# Patient Record
Sex: Female | Born: 1935 | Race: White | Hispanic: No | Smoking: Never smoker
Health system: Southern US, Community
[De-identification: ages and names within clinical notes are randomized; demographics above are authoritative.]

## PROBLEM LIST (undated history)

## (undated) DIAGNOSIS — F419 Anxiety disorder, unspecified: Secondary | ICD-10-CM

## (undated) DIAGNOSIS — M810 Age-related osteoporosis without current pathological fracture: Secondary | ICD-10-CM

## (undated) DIAGNOSIS — C801 Malignant (primary) neoplasm, unspecified: Secondary | ICD-10-CM

## (undated) DIAGNOSIS — I10 Essential (primary) hypertension: Secondary | ICD-10-CM

## (undated) DIAGNOSIS — K219 Gastro-esophageal reflux disease without esophagitis: Secondary | ICD-10-CM

## (undated) DIAGNOSIS — E119 Type 2 diabetes mellitus without complications: Secondary | ICD-10-CM

## (undated) DIAGNOSIS — M1990 Unspecified osteoarthritis, unspecified site: Secondary | ICD-10-CM

## (undated) HISTORY — PX: CHOLECYSTECTOMY: SHX55

## (undated) HISTORY — DX: Essential (primary) hypertension: I10

## (undated) HISTORY — PX: BREAST SURGERY: SHX581

## (undated) HISTORY — DX: Age-related osteoporosis without current pathological fracture: M81.0

## (undated) HISTORY — DX: Gastro-esophageal reflux disease without esophagitis: K21.9

## (undated) HISTORY — PX: APPENDECTOMY: SHX54

## (undated) HISTORY — DX: Unspecified osteoarthritis, unspecified site: M19.90

## (undated) HISTORY — DX: Malignant (primary) neoplasm, unspecified: C80.1

## (undated) HISTORY — PX: TONSILLECTOMY: SUR1361

## (undated) HISTORY — DX: Anxiety disorder, unspecified: F41.9

## (undated) HISTORY — PX: EYE SURGERY: SHX253

## (undated) HISTORY — DX: Type 2 diabetes mellitus without complications: E11.9

## (undated) HISTORY — PX: THUMB ARTHROSCOPY: SHX2509

---
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
## 1998-05-15 ENCOUNTER — Encounter: Payer: Self-pay | Admitting: Family Medicine

## 1998-05-15 ENCOUNTER — Ambulatory Visit (HOSPITAL_COMMUNITY): Admission: RE | Admit: 1998-05-15 | Discharge: 1998-05-15 | Payer: Self-pay | Admitting: Family Medicine

## 1999-02-16 ENCOUNTER — Other Ambulatory Visit: Admission: RE | Admit: 1999-02-16 | Discharge: 1999-02-16 | Payer: Self-pay | Admitting: Family Medicine

## 1999-11-17 ENCOUNTER — Ambulatory Visit (HOSPITAL_BASED_OUTPATIENT_CLINIC_OR_DEPARTMENT_OTHER): Admission: RE | Admit: 1999-11-17 | Discharge: 1999-11-17 | Payer: Self-pay | Admitting: Orthopedic Surgery

## 2000-05-26 ENCOUNTER — Other Ambulatory Visit: Admission: RE | Admit: 2000-05-26 | Discharge: 2000-05-26 | Payer: Self-pay | Admitting: Family Medicine

## 2000-06-07 ENCOUNTER — Encounter (HOSPITAL_BASED_OUTPATIENT_CLINIC_OR_DEPARTMENT_OTHER): Payer: Self-pay | Admitting: General Surgery

## 2000-06-07 ENCOUNTER — Encounter: Admission: RE | Admit: 2000-06-07 | Discharge: 2000-06-07 | Payer: Self-pay | Admitting: General Surgery

## 2000-12-06 ENCOUNTER — Encounter: Payer: Self-pay | Admitting: Family Medicine

## 2000-12-06 ENCOUNTER — Encounter: Admission: RE | Admit: 2000-12-06 | Discharge: 2000-12-06 | Payer: Self-pay | Admitting: Family Medicine

## 2000-12-21 ENCOUNTER — Encounter (INDEPENDENT_AMBULATORY_CARE_PROVIDER_SITE_OTHER): Payer: Self-pay | Admitting: Specialist

## 2000-12-21 ENCOUNTER — Ambulatory Visit (HOSPITAL_COMMUNITY): Admission: RE | Admit: 2000-12-21 | Discharge: 2000-12-21 | Payer: Self-pay | Admitting: Gastroenterology

## 2001-07-05 ENCOUNTER — Encounter: Admission: RE | Admit: 2001-07-05 | Discharge: 2001-07-05 | Payer: Self-pay | Admitting: Family Medicine

## 2001-07-05 ENCOUNTER — Encounter: Payer: Self-pay | Admitting: Family Medicine

## 2002-05-02 ENCOUNTER — Encounter: Payer: Self-pay | Admitting: Otolaryngology

## 2002-05-02 ENCOUNTER — Encounter: Admission: RE | Admit: 2002-05-02 | Discharge: 2002-05-02 | Payer: Self-pay | Admitting: Otolaryngology

## 2002-07-16 ENCOUNTER — Other Ambulatory Visit: Admission: RE | Admit: 2002-07-16 | Discharge: 2002-07-16 | Payer: Self-pay | Admitting: Family Medicine

## 2002-07-31 ENCOUNTER — Encounter: Admission: RE | Admit: 2002-07-31 | Discharge: 2002-07-31 | Payer: Self-pay | Admitting: Family Medicine

## 2002-07-31 ENCOUNTER — Encounter: Payer: Self-pay | Admitting: Family Medicine

## 2003-02-08 ENCOUNTER — Ambulatory Visit (HOSPITAL_COMMUNITY): Admission: RE | Admit: 2003-02-08 | Discharge: 2003-02-08 | Payer: Self-pay | Admitting: Gastroenterology

## 2003-02-08 ENCOUNTER — Encounter (INDEPENDENT_AMBULATORY_CARE_PROVIDER_SITE_OTHER): Payer: Self-pay | Admitting: Specialist

## 2003-03-09 HISTORY — PX: KNEE SURGERY: SHX244

## 2003-06-20 ENCOUNTER — Encounter: Admission: RE | Admit: 2003-06-20 | Discharge: 2003-06-20 | Payer: Self-pay | Admitting: Orthopaedic Surgery

## 2003-06-25 ENCOUNTER — Ambulatory Visit (HOSPITAL_BASED_OUTPATIENT_CLINIC_OR_DEPARTMENT_OTHER): Admission: RE | Admit: 2003-06-25 | Discharge: 2003-06-25 | Payer: Self-pay | Admitting: Orthopaedic Surgery

## 2003-06-25 ENCOUNTER — Ambulatory Visit: Admission: RE | Admit: 2003-06-25 | Discharge: 2003-06-25 | Payer: Self-pay | Admitting: Orthopaedic Surgery

## 2003-08-22 ENCOUNTER — Encounter: Admission: RE | Admit: 2003-08-22 | Discharge: 2003-08-22 | Payer: Self-pay | Admitting: Family Medicine

## 2003-09-12 ENCOUNTER — Other Ambulatory Visit: Admission: RE | Admit: 2003-09-12 | Discharge: 2003-09-12 | Payer: Self-pay | Admitting: Family Medicine

## 2004-02-17 ENCOUNTER — Ambulatory Visit (HOSPITAL_COMMUNITY): Admission: RE | Admit: 2004-02-17 | Discharge: 2004-02-17 | Payer: Self-pay | Admitting: Specialist

## 2004-09-11 ENCOUNTER — Encounter: Admission: RE | Admit: 2004-09-11 | Discharge: 2004-09-11 | Payer: Self-pay | Admitting: Family Medicine

## 2005-03-30 ENCOUNTER — Encounter: Admission: RE | Admit: 2005-03-30 | Discharge: 2005-06-28 | Payer: Self-pay | Admitting: Family Medicine

## 2005-09-16 ENCOUNTER — Encounter: Admission: RE | Admit: 2005-09-16 | Discharge: 2005-09-16 | Payer: Self-pay | Admitting: Family Medicine

## 2005-09-24 ENCOUNTER — Encounter: Admission: RE | Admit: 2005-09-24 | Discharge: 2005-09-24 | Payer: Self-pay | Admitting: Family Medicine

## 2006-04-11 ENCOUNTER — Encounter: Admission: RE | Admit: 2006-04-11 | Discharge: 2006-04-11 | Payer: Self-pay | Admitting: Family Medicine

## 2006-09-27 ENCOUNTER — Encounter: Admission: RE | Admit: 2006-09-27 | Discharge: 2006-09-27 | Payer: Self-pay | Admitting: Family Medicine

## 2006-12-01 ENCOUNTER — Encounter: Admission: RE | Admit: 2006-12-01 | Discharge: 2006-12-01 | Payer: Self-pay | Admitting: Family Medicine

## 2007-10-16 ENCOUNTER — Encounter: Admission: RE | Admit: 2007-10-16 | Discharge: 2007-10-16 | Payer: Self-pay | Admitting: Family Medicine

## 2008-10-16 ENCOUNTER — Encounter: Admission: RE | Admit: 2008-10-16 | Discharge: 2008-10-16 | Payer: Self-pay | Admitting: Family Medicine

## 2009-11-13 ENCOUNTER — Encounter: Admission: RE | Admit: 2009-11-13 | Discharge: 2009-11-13 | Payer: Self-pay | Admitting: Family Medicine

## 2010-03-29 ENCOUNTER — Encounter: Payer: Self-pay | Admitting: Family Medicine

## 2010-07-24 NOTE — Op Note (Signed)
[place]  Patient:    Melody Tran, Melody Tran                      MRN: [id number] Proc. Date: 11/17/99 Adm. Date:  [id number] Attending:  Susa Day CC:         Katy Fitch. Sypher, Montez Hageman., M.D. (2 copies)   Operative Report  PREOPERATIVE DIAGNOSIS:  Large mucus cyst, dorsal aspect, left thumb interphalangeal joint.  POSTOPERATIVE DIAGNOSIS: Large mucus cyst, dorsal aspect, left thumb interphalangeal joint.  OPERATION:  Irrigation and debridement of left thumb interphalangeal joint with removal of multiple osteophytes and loose bodies followed by excision of a mucus cyst.  SURGEON:  Katy Fitch. Naaman Plummer., M.D.  ANESTHESIA:  0.25% Marcaine and 2% lidocaine, metacarpal head level block, left thumb, supplemented by IV sedation.  SUPERVISING ANESTHESIOLOGIST:  Halford Decamp, M.D.  INDICATIONS:  Melody Tran is a 77 year old woman who developed a large mucus cyst on the dorsal aspect of her left thumb IP joint.  She noted marked thinning of the skin and had spontaneous drainage from the cyst in the past. 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 We recommended cyst excision and joint debridement with removal of osteophytes and loose bodies.  After informed consent, she is brought to the operating room at this time.  DESCRIPTION OF PROCEDURE:  Melody Tran was brought to the operating room and placed in supine position upon the operating table.  Following placement of 0.25% Marcaine and 2% Marcaine and metacarpal head level block, the left arm was prepped with Betadine soap and solution and sterilely draped. Following exsanguination of the thumb with gauze wrap, a 1 inch wide strip of Esmarch bandage was used as a digital tourniquet.  The procedure commenced with a curvilinear incision, excising the thin skin at the apex of the cyst.  Subcutaneous tissue was carefully divided,  taking care to identify the cyst cavity.  The distal circumference was dissected and entire membrane removed.  A arthrotomy was then performed on the radial aspect of the extensor mechanism between the central slip and the radial collateral ligament.  The capsule was removed, and immediately a large loose body was removed.  An osteophyte on the distal phalanx was likewise removed.  The joint was then inspected.  Several other loose bodies were removed, and the wound repaired with interrupted sutures of 5-0 nylon.  There were no apparent complications.  Melody Tran tolerated surgery and anesthesia well.  She was transferred to the recovery room with stable vital signs. DD:  11/17/99 TD:  11/18/99 Job: [id number]

## 2010-07-24 NOTE — Procedures (Signed)
[facility]  Patient:    Melody Tran, Melody Tran Visit Number: [id number] MRN: [id number]          Service Type: Attending:  Petra Kuba, M.D. Proc. Date: 12/21/00   CC:         Gretta Arab. Valentina Lucks, M.D.   Procedure Report  PROCEDURE:  Esophagogastroduodenoscopy with biopsy.  INDICATION:  Upper tract symptoms not responding to the usual medicines. Consent was signed after risks, benefits, methods, and options thoroughly discussed in the office.  MEDICATIONS:  Demerol 70, Versed 5.  DESCRIPTION OF PROCEDURE:  Video endoscope was inserted by direct vision.  The esophagus was normal.  In the distal esophagus was a tiny hiatal hernia.  The scope passed into the stomach where a moderate amount of bilious material was suctioned and advanced through a normal antrum, normal pylorus, into a normal duodenal bulb and around the C-loop to a normal second and probably third part of the duodenum.  The scope was withdrawn back to the bulb, and a good look there ruled out ulcers in that location.  The scope was withdrawn back to the stomach which was pertinent for some multiple gastric polyps along the mid part of the stomach.  The scope was retroflexed, revealing a normal angularis, cardia, and fundus, but the polyps were confirmed in the lesser and greater curve.  The scope was straightened, and straight visualization of the stomach confirmed the above.  There was some minimal gastritis seen.  Multiple biopsies of these polyps were obtained.  Air was suctioned and the scope withdrawn.  Again, a good look at the esophagus on slow withdrawal was normal. The patient tolerated the procedure well.  There was no obvious immediate complication.  ENDOSCOPIC DIAGNOSES: 1. Tiny hiatal hernia. 2. Multiple gastric polyps, status post biopsy. 3. Minimal gastritis. 4. Otherwise normal esophagogastroduodenoscopy.  PLAN:  Await pathology.  Considerations of trying Carafate for bile  reflux gastritis, also a consideration of an ultrasound to rule out residual common bile duct stones if her pains continue and follow up p.r.n. or in two months to reevaluate. Attending:  Petra Kuba, M.D. DD:  12/21/00 TD:  12/21/00 Job: [id number]

## 2010-07-24 NOTE — Op Note (Signed)
Melody Tran, Melody Tran                         ACCOUNT NO.:  [id number]   MEDICAL RECORD NO.:  [id number]                   PATIENT TYPE:  AMB   LOCATION:  ENDO                                 FACILITY:  [facility]   PHYSICIAN:  Petra Kuba, M.D.                 DATE OF BIRTH:  12-Jan-1929   DATE OF PROCEDURE:  02/08/2003  DATE OF DISCHARGE:                                 OPERATIVE REPORT   PROCEDURE:  Esophagogastroduodenoscopy with biopsy.   INDICATIONS FOR PROCEDURE:  A patient with a history of gastric polyps,  upper tract symptoms.   Consent was signed after risks, benefits, methods, and options were  thoroughly discussed in the office.   ADDITIONAL MEDICINES FOR THIS PROCEDURE:  Demerol 20, Versed 2.   DESCRIPTION OF PROCEDURE:  The video endoscope was inserted by direct  vision.  The proximal and mid esophagus were normal. She did have a small  hiatal hernia.  The scope passed into the stomach in the proximal and mid  stomach. Multiple tiny to small polyps were seen without any worrisome  stigmata. The distal stomach and antrum were fine. The scope passed through  a normal pylorus into a normal duodenal bulb and around the C loop to a  normal second portion of the duodenum. The scope was withdrawn back to the  bulb and a good look there ruled out abnormalities in that location. The  scope was withdrawn back to the stomach and retroflexed. The angularis,  cardia and fundus were all normal. The polyps were confirmed on both  retroflexed and straight visualization without any additional findings with  good evaluation of the lesser and greater curve. We went ahead and took  multiple biopsies of some of the polyps but not all, air was suctioned,  scope slowly withdrawn.  Again a good look at the esophagus was normal,  scope was removed. The patient tolerated the procedure well. There was no  obvious or immediate complication.   ENDOSCOPIC DIAGNOSIS:  1. Small hiatal hernia.  2.  Multiple proximal and mid gastric polyps biopsied.  3. Otherwise within normal limits esophagogastroduodenoscopy.   PLAN:  Await pathology, continue Nexium 20 since that seems to be helping.  Followup p.r.n. or in 2-3 months to recheck symptoms and make sure no  further workup plans are needed.                                               Petra Kuba, M.D.    MEM/MEDQ  D:  02/08/2003  T:  02/08/2003  Job:  [id number]   cc:   Gretta Arab. Valentina Lucks, M.D.  [address] [place]  [place] [address]  Fax: [phone]

## 2010-07-24 NOTE — Op Note (Signed)
NAMEChealsea, Melody Tran                         ACCOUNT NO.:  [id number]   MEDICAL RECORD NO.:  [id number]                   PATIENT TYPE:  AMB   LOCATION:  DSC                                  FACILITY:  [facility]   PHYSICIAN:  Lubertha Basque. Jerl Santos, M.D.             DATE OF BIRTH:  1928-12-12   DATE OF PROCEDURE:  06/25/2003  DATE OF DISCHARGE:                                 OPERATIVE REPORT   PREOPERATIVE DIAGNOSES:  1. Left knee torn lateral meniscus.  2. Left knee cyst.   POSTOPERATIVE DIAGNOSES:  1. Left knee torn medial and torn lateral meniscus.  2. 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  3. Left knee cyst.   OPERATION PERFORMED:  1. Left knee partial medial and partial lateral meniscectomy.  2. Left knee chondroplasty medial and patellofemoral compartments.  3. Left knee aspiration lateral cyst.   SURGEON:  Lubertha Basque. Jerl Santos, M.D.   ASSISTANT:  Prince Rome, P.A.   ANESTHESIA:  Knee block, MAC.   INDICATIONS FOR PROCEDURE:  The patient is a 77 year old woman with a long  history of left knee pain and swelling and cyst on the lateral aspect.  This  has persisted despite oral anti-inflammatories and activity restriction.  She has undergone MRI scan which shows a complex lateral meniscal tear along  with an associated cyst.  She has pain at rest and pain with activities and  is offered an arthroscopy.  Informed operative consent was obtained after  discussion of possible complications of reaction to anesthesia and  infection.   DESCRIPTION OF PROCEDURE:  The patient was taken to the operating suite  where knee block and MAC were applied.  The patient was positioned supine  and prepped and draped in the normal sterile fashion.  After administration  of preop IV antibiotics, an arthroscopy of the left knee was performed  through two inferior portals.  Suprapatellar pouch was benign while the  patellofemoral joint exhibited some focal grade  3 change on the patella  addressed with a brief chondroplasty.  The medial compartment exhibited some  grade 3 change towards the notch again in a focal area addressed with a  brief chondroplasty.  She did have a degenerative tear of the posterior horn  of the medial meniscus addressed with a 5% partial medial meniscectomy.  ACL  appeared to be intact.  The lateral compartment exhibited a complex middle  horn meniscus tear addressed with a 10% partial lateral meniscectomy.  I  then performed aspiration of the cyst on the side of the knee with an 18  gauge needle and this decompressed fully.  The knee was thoroughly irrigated  followed by placement of Marcaine with epinephrine and morphine.  Adaptic  was placed over the portals followed by dry gauze and a loose  Ace wrap.  Estimated blood loss and intraoperative fluids as well as accurate  tourniquet time can be obtained from  anesthesia records.   DISPOSITION:  The patient was taken to the recovery room in stable  condition.  Plans were for the patient to go home the same day and to follow  up in the office in less than a week.  I will contact her by phone tonight.                                               Lubertha Basque Jerl Santos, M.D.    PGD/MEDQ  D:  06/25/2003  T:  06/25/2003  Job:  [id number]

## 2010-07-24 NOTE — Op Note (Signed)
NAMEGracynn, Melody Tran Zareah E                         ACCOUNT NO.:  [id number]   MEDICAL RECORD NO.:  [id number]                   PATIENT TYPE:  AMB   LOCATION:  ENDO                                 FACILITY[facility]  [facility]   PHYSICIAN:  Petra Kuba, M.D.                 DATE OF BIRTH:  [birth year]   DATE OF PROCEDURE:  02/08/2003  DATE OF DISCHARGE:                                 OPERATIVE REPORT   PROCEDURE:  Colonoscopy with biopsy.   INDICATIONS FOR PROCEDURE:  A patient with history of weight loss, some GI  symptoms, due for colonic screening.  Consent was signed after risks,  benefits, methods, and options were thoroughly discussed in the office.   MEDICINES USED:  Demerol 50, Versed 5.   DESCRIPTION OF PROCEDURE:  Rectal inspection was pertinent for small  external hemorrhoids. Digital exam was negative. The pediatric video  adjustable colonoscope was inserted, easily advanced around the colon to the  cecum. This did require various abdominal pressures, rolling her on her  back. On insertion in the proximal level of the splenic flexure, a small  polyp was seen and was cold biopsied. The scope was inserted a short ways  into the terminal ileum which was normal, photo documentation was obtained.  No other abnormalities but the one small polyp mentioned above was seen on  insertion. The scope was slowly withdrawn. The prep was adequate. There was  some stool adherent to the wall of the cecum but the rest of the prep was  adequate with washing and suctioning.  The scope was slowly withdrawn. No  right sided abnormalities were seen as we slowly withdrew back to the biopsy  site and there was no obvious residual polyp in that area. The scope was  slowly withdrawn. In the distal sigmoid and rectum a few hyperplastic  appearing polyps were seen which were also cold biopsied and put in the same  container. Anal rectal pull through and retroflexion back in the rectum  confirmed some small  hemorrhoids.  No other abnormalities were seen. The  scope was reinserted a short ways up the left side of the colon, air was  suctioned, scope removed. The patient tolerated the procedure well. There  was no obvious or immediate complications.   ENDOSCOPIC DIAGNOSIS:  1. Internal and external hemorrhoids.  2. Tiny rectal, sigmoid and splenic flexure polyps cold biopsied.  3. Otherwise within normal limits to the terminal ileum.   PLAN:  Await pathology to determine future colonic screening. Continue  workup with an esophagogastroduodenoscopy.                                               Petra Kuba, M.D.   MEM/MEDQ  D:  02/08/2003  T:  02/08/2003  Job:  [id number]   cc:   Gretta Arab. Valentina Lucks, M.D.  [address] [place]  [place] [address]  Fax: [phone]

## 2011-01-01 ENCOUNTER — Other Ambulatory Visit: Payer: Self-pay | Admitting: Family Medicine

## 2011-01-01 DIAGNOSIS — Z1231 Encounter for screening mammogram for malignant neoplasm of breast: Secondary | ICD-10-CM

## 2011-01-08 ENCOUNTER — Other Ambulatory Visit: Payer: Self-pay | Admitting: Dermatology

## 2011-01-27 ENCOUNTER — Ambulatory Visit
Admission: RE | Admit: 2011-01-27 | Discharge: 2011-01-27 | Disposition: A | Discharge status: Home / Self Care | Payer: PRIVATE HEALTH INSURANCE | Source: Ambulatory Visit | Attending: Family Medicine | Admitting: Family Medicine

## 2011-01-27 DIAGNOSIS — Z1231 Encounter for screening mammogram for malignant neoplasm of breast: Secondary | ICD-10-CM

## 2011-05-03 ENCOUNTER — Ambulatory Visit (INDEPENDENT_AMBULATORY_CARE_PROVIDER_SITE_OTHER): Payer: PRIVATE HEALTH INSURANCE | Admitting: Family Medicine

## 2011-05-03 VITALS — BP 125/71 | HR 81 | Temp 97.9°F | Resp 16 | Ht 66.0 in | Wt 122.6 lb

## 2011-05-03 DIAGNOSIS — D239 Other benign neoplasm of skin, unspecified: Secondary | ICD-10-CM

## 2011-05-03 DIAGNOSIS — L723 Sebaceous cyst: Secondary | ICD-10-CM

## 2011-05-03 DIAGNOSIS — B999 Unspecified infectious disease: Secondary | ICD-10-CM

## 2011-05-03 MED ORDER — SULFAMETHOXAZOLE-TRIMETHOPRIM 800-160 MG PO TABS: 1.0000 | ORAL_TABLET | Freq: Two times a day (BID) | ORAL | Status: AC

## 2011-05-03 NOTE — Patient Instructions (Signed)
Keep area dry for 48 hours Return in 2 days for recheck of incision and for dressing change.   Return 2/27 between 9-12pm to see Kennedy Bucker, PA-C.

## 2011-05-03 NOTE — Progress Notes (Signed)
  [facility]:  Office Visit  Chief Complaint:  Chief Complaint  Patient presents with  . Cellulitis    R shoulder x5 days    HPI: Melody Tran is a 78 y.o. female who complains of  Right shoulder pain x 5 days relating to soreness of her sebaceous cyst. She has had the cyst for over a year but on Thursday she hit edges of the cyst. More redness in the area. She is having problems laying on it. Warm compresses without relief.   Past Medical History  Diagnosis Date  . Hypertension   . Anxiety    No past surgical history on file.  Family History  Problem Relation Age of Onset  . Anemia Mother    No Known Allergies Prior to Admission medications   Medication Sig Start Date End Date Taking? Authorizing Provider  ALPRAZolam Prudy Feeler) 0.5 MG tablet Take 0.25 mg by mouth at bedtime as needed.   Yes Historical Provider, MD  atenolol (TENORMIN) 25 MG tablet Take 25 mg by mouth daily.   Yes Historical Provider, MD     ROS: The patient denies fevers, chills, night sweats, unintentional weight loss, chest pain, palpitations, wheezing, dyspnea on exertion, nausea, vomiting, abdominal pain, dysuria, hematuria, melena, numbness, weakness, or tingling. + soreness of right shoulder seb. cyst  All other systems have been reviewed and were otherwise negative with the exception of those mentioned in the HPI and as above.    PHYSICAL EXAM: Filed Vitals:   05/03/11 1131  BP: 125/71  Pulse: 81  Temp: 97.9 F (36.6 C)  Resp: 16   Filed Vitals:   05/03/11 1131  Height: 5\' 6"  (1.676 m)  Weight: 122 lb 9.6 oz (55.611 kg)   Body mass index is 19.79 kg/(m^2).  General: Alert, no acute distress HEENT:  Normocephalic, atraumatic, oropharynx patent. Cardiovascular:  Regular rate and rhythm, no rubs murmurs or gallops.  No Carotid bruits, radial pulse intact. No pedal edema.  Respiratory: Clear to auscultation bilaterally.  No wheezes, rales, or rhonchi.  No cyanosis, no use  of accessory musculature GI: No organomegaly, abdomen is soft and non-tender, positive bowel sounds.  No masses. Skin: + red, tender right shoulder sebaceous cyst, 3/4 inch, raised.  Neurologic: Facial musculature symmetric. Psychiatric: Patient is appropriate throughout our interaction. Lymphatic: No cervical lymphadenopathy Musculoskeletal: Gait intact.    ASSESSMENT/PLAN: Encounter Diagnosis  Name Primary?  . Sebaceous cyst Yes   Infected cyst-Rx Bactrim DS BID x 10 days. F/u as directed.     Hamilton Capri PHUONG, DO 05/03/2011 12:17 PM

## 2011-05-03 NOTE — Progress Notes (Signed)
Procedure:  VCO 2% plain Lidocaine injected locally ~2 cc.  Cleansed with Sterile Water. 4 mm punch used over dilated pore to reveal copious purulent drainage with sebaceous material. Partial cyst wall removed. Culture obtained. Penrose drain inserted then # 1 horizontal mattress with 4.0 ethilon to partially close.  Cleansed/dressed.  Pt tolerated this well.

## 2011-05-05 ENCOUNTER — Ambulatory Visit (INDEPENDENT_AMBULATORY_CARE_PROVIDER_SITE_OTHER): Payer: PRIVATE HEALTH INSURANCE | Admitting: Physician Assistant

## 2011-05-05 VITALS — BP 110/64 | HR 80 | Temp 97.3°F | Resp 18 | Wt 122.0 lb

## 2011-05-05 DIAGNOSIS — L0390 Cellulitis, unspecified: Secondary | ICD-10-CM

## 2011-05-05 DIAGNOSIS — L0291 Cutaneous abscess, unspecified: Secondary | ICD-10-CM

## 2011-05-05 NOTE — Progress Notes (Signed)
  Subjective:    Patient ID: Melody Tran, female    DOB: January 05, 1929, 78 y.o.   MRN: [id number]  HPI Melody Tran is here today for follow up of I&D on Right trapezius area on 05/03/11.  She is having some discomfort but it is tolerable.  She says she is tolerating the medication. The wound culture is still pending.   Review of Systems No fever, chills, increased redness, nausea or vomiting  r  Objective:   Physical Exam Right trapezius area with dressing intact.  Minimal drainage noted. While removing dressing, penrose pulled out.  Some purulent drainage expressed.  #1 suture removed.  Irrigated incision with 1% Lidocaine plain.  Patient became slightly dizzy but recovered when lied down on table. I proceeded to insert 1/4" packing, cleanse wound and redressed.  Patient tolerated this with minimal problem.      Assessment & Plan:  Cellulitis,shoulder/trapezius area Sebaceous Cyst S/P I&D  Continue Septra. Return 48 hours for recheck wound. Instructed pt on home wound care.

## 2011-05-06 LAB — WOUND CULTURE: Gram Stain: NONE SEEN

## 2011-05-07 ENCOUNTER — Ambulatory Visit (INDEPENDENT_AMBULATORY_CARE_PROVIDER_SITE_OTHER): Payer: PRIVATE HEALTH INSURANCE | Admitting: Physician Assistant

## 2011-05-07 VITALS — BP 106/61 | HR 91 | Temp 97.8°F | Resp 16 | Ht 66.0 in | Wt 122.4 lb

## 2011-05-07 DIAGNOSIS — L0291 Cutaneous abscess, unspecified: Secondary | ICD-10-CM

## 2011-05-07 DIAGNOSIS — L0390 Cellulitis, unspecified: Secondary | ICD-10-CM

## 2011-05-07 NOTE — Progress Notes (Signed)
  Subjective:    Patient ID: Melody Tran, female    DOB: 1928/04/19, 78 y.o.   MRN: [id number]  HPI Melody Tran is here for dressing change and wound recheck s/p I&D.  This is her second follow up visit. She states that she is tolerating Septra well except she has to crush the pills and that the wound is starting to itch.  Culture reveals multiorganisms. Negative MRSA.    Review of Systems No fever or chills.  No new area of redness.    Objective:   Physical Exam Dressing removed; little drainage is noted.  Packing removed easily with only bloody drainage noted.  No purulent drainage expressed.  Decreased induration and erythema. Cleansed with soap and water. No new packing inserted. Redressed.       Assessment & Plan:  Cellulitis, shoulder/trapezius area S/P I&D sebaceous cyst  Finish Septra. Wound care reviewed. Watch for any worsening redness, induration or pain. No further follow up needed unless problems occur.

## 2011-05-07 NOTE — Patient Instructions (Signed)
Soap and water for home wound care. Watch for worsening symptoms.

## 2012-08-23 ENCOUNTER — Ambulatory Visit (INDEPENDENT_AMBULATORY_CARE_PROVIDER_SITE_OTHER): Payer: PRIVATE HEALTH INSURANCE | Admitting: Family Medicine

## 2012-08-23 VITALS — BP 126/67 | HR 80 | Temp 98.2°F | Resp 18 | Wt 114.0 lb

## 2012-08-23 DIAGNOSIS — N63 Unspecified lump in unspecified breast: Secondary | ICD-10-CM

## 2012-08-23 DIAGNOSIS — N6310 Unspecified lump in the right breast, unspecified quadrant: Secondary | ICD-10-CM

## 2012-08-23 NOTE — Progress Notes (Signed)
Subjective: 79 year old lady who is here because she noted a mass in her right breast over the last 2 weeks. She has lost about 8 pounds in the last 3 months intentionally. With the weight loss she noted a change in her breast. She noted a dimpling inward laterally on the right breast, I could feel the mass there. She had fibrocystic breast issues when she was young, and had a biopsy almost 34 years ago. She says the right breast is quite tender. She does not know how she can get a mammogram because of the discomfort. Her last mammogram was 18 months ago. She was examined by her physician 5 months ago, and nothing noted.  Objective: Pleasant alert lady in no distress. Left breast is entirely normal. Right breast has a mild dimpling far laterally. Between about 9 and 11:00 position toward the periphery of the breast is a mass which palpates about 3 x 4 cm. It is readily movable. No axillary nodes are noted. The mass is tender and firm.  Assessment: Right breast mass  Plan: Get a mammogram and evaluation the breast Center as soon as possible.

## 2012-08-23 NOTE — Patient Instructions (Signed)
We will refer you to the breast Center for evaluation. If you do not hear from that referral back tomorrow afternoon called back and ask if it is in progress.

## 2012-08-25 ENCOUNTER — Other Ambulatory Visit: Payer: Self-pay | Admitting: Radiology

## 2012-08-25 DIAGNOSIS — N6310 Unspecified lump in the right breast, unspecified quadrant: Secondary | ICD-10-CM

## 2012-09-07 ENCOUNTER — Other Ambulatory Visit: Payer: Self-pay | Admitting: Family Medicine

## 2012-09-07 ENCOUNTER — Ambulatory Visit
Admission: RE | Admit: 2012-09-07 | Discharge: 2012-09-07 | Disposition: A | Discharge status: Home / Self Care | Payer: Medicare Other | Source: Ambulatory Visit | Attending: Family Medicine | Admitting: Family Medicine

## 2012-09-07 DIAGNOSIS — N6310 Unspecified lump in the right breast, unspecified quadrant: Secondary | ICD-10-CM

## 2012-09-14 ENCOUNTER — Telehealth: Payer: Self-pay | Admitting: Family Medicine

## 2012-09-14 NOTE — Telephone Encounter (Signed)
Spoke to patient on the phone. She is scheduled for a needle biopsy of her breast tomorrow.

## 2012-09-15 ENCOUNTER — Ambulatory Visit
Admission: RE | Admit: 2012-09-15 | Discharge: 2012-09-15 | Disposition: A | Discharge status: Home / Self Care | Payer: Medicare Other | Source: Ambulatory Visit | Attending: Family Medicine | Admitting: Family Medicine

## 2012-09-15 ENCOUNTER — Other Ambulatory Visit: Payer: Self-pay | Admitting: Family Medicine

## 2012-09-15 DIAGNOSIS — N6310 Unspecified lump in the right breast, unspecified quadrant: Secondary | ICD-10-CM

## 2012-09-18 ENCOUNTER — Other Ambulatory Visit: Payer: Self-pay | Admitting: Family Medicine

## 2012-09-18 DIAGNOSIS — C50911 Malignant neoplasm of unspecified site of right female breast: Secondary | ICD-10-CM

## 2012-09-19 ENCOUNTER — Telehealth: Payer: Self-pay | Admitting: *Deleted

## 2012-09-19 DIAGNOSIS — C50411 Malignant neoplasm of upper-outer quadrant of right female breast: Secondary | ICD-10-CM

## 2012-09-19 HISTORY — DX: Malignant neoplasm of upper-outer quadrant of right female breast: C50.411

## 2012-09-19 NOTE — Telephone Encounter (Signed)
Received a return phone call from patient and confirmed her [facility] appt. For 09/27/12 at 12N.  Instructions and contact information given.  No questions or concerns at this time.

## 2012-09-22 ENCOUNTER — Ambulatory Visit
Admission: RE | Admit: 2012-09-22 | Discharge: 2012-09-22 | Disposition: A | Discharge status: Home / Self Care | Payer: Medicare Other | Source: Ambulatory Visit | Attending: Family Medicine | Admitting: Family Medicine

## 2012-09-22 DIAGNOSIS — C50911 Malignant neoplasm of unspecified site of right female breast: Secondary | ICD-10-CM

## 2012-09-22 MED ORDER — GADOBENATE DIMEGLUMINE 529 MG/ML IV SOLN
10.0000 mL | Freq: Once | INTRAVENOUS | Status: AC | PRN
  Administered 2012-09-22: 10 mL via INTRAVENOUS

## 2012-09-27 ENCOUNTER — Encounter: Payer: Self-pay | Admitting: Oncology

## 2012-09-27 ENCOUNTER — Encounter: Payer: Self-pay | Admitting: *Deleted

## 2012-09-27 ENCOUNTER — Ambulatory Visit (HOSPITAL_BASED_OUTPATIENT_CLINIC_OR_DEPARTMENT_OTHER): Payer: Medicare Other | Admitting: Oncology

## 2012-09-27 ENCOUNTER — Ambulatory Visit: Payer: Medicare Other | Attending: General Surgery | Admitting: Physical Therapy

## 2012-09-27 ENCOUNTER — Encounter (INDEPENDENT_AMBULATORY_CARE_PROVIDER_SITE_OTHER): Payer: Self-pay | Admitting: General Surgery

## 2012-09-27 ENCOUNTER — Other Ambulatory Visit (HOSPITAL_BASED_OUTPATIENT_CLINIC_OR_DEPARTMENT_OTHER): Payer: Medicare Other | Admitting: Lab

## 2012-09-27 ENCOUNTER — Ambulatory Visit
Admission: RE | Admit: 2012-09-27 | Discharge: 2012-09-27 | Disposition: A | Discharge status: Home / Self Care | Payer: Medicare Other | Source: Ambulatory Visit | Attending: Radiation Oncology | Admitting: Radiation Oncology

## 2012-09-27 ENCOUNTER — Ambulatory Visit: Payer: Medicare Other

## 2012-09-27 ENCOUNTER — Ambulatory Visit (HOSPITAL_BASED_OUTPATIENT_CLINIC_OR_DEPARTMENT_OTHER): Payer: PRIVATE HEALTH INSURANCE | Admitting: General Surgery

## 2012-09-27 VITALS — BP 138/81 | HR 91 | Temp 97.8°F | Resp 20 | Ht 66.0 in | Wt 110.1 lb

## 2012-09-27 DIAGNOSIS — C50411 Malignant neoplasm of upper-outer quadrant of right female breast: Secondary | ICD-10-CM

## 2012-09-27 DIAGNOSIS — R293 Abnormal posture: Secondary | ICD-10-CM | POA: Insufficient documentation

## 2012-09-27 DIAGNOSIS — C50919 Malignant neoplasm of unspecified site of unspecified female breast: Secondary | ICD-10-CM | POA: Insufficient documentation

## 2012-09-27 DIAGNOSIS — IMO0001 Reserved for inherently not codable concepts without codable children: Secondary | ICD-10-CM | POA: Insufficient documentation

## 2012-09-27 DIAGNOSIS — C50419 Malignant neoplasm of upper-outer quadrant of unspecified female breast: Secondary | ICD-10-CM

## 2012-09-27 DIAGNOSIS — M25569 Pain in unspecified knee: Secondary | ICD-10-CM | POA: Insufficient documentation

## 2012-09-27 DIAGNOSIS — Z01818 Encounter for other preprocedural examination: Secondary | ICD-10-CM | POA: Insufficient documentation

## 2012-09-27 LAB — CBC WITH DIFFERENTIAL/PLATELET
Basophils Absolute: 0.1 10*3/uL (ref 0.0–0.1)
EOS%: 1.3 % (ref 0.0–7.0)
HCT: 37.1 % (ref 34.8–46.6)
MCHC: 34 g/dL (ref 31.5–36.0)
MCV: 89 fL (ref 79.5–101.0)
MONO#: 0.6 10*3/uL (ref 0.1–0.9)
Platelets: 308 10*3/uL (ref 145–400)
RBC: 4.17 10*6/uL (ref 3.70–5.45)
RDW: 12.9 % (ref 11.2–14.5)
lymph#: 2 10*3/uL (ref 0.9–3.3)

## 2012-09-27 LAB — COMPREHENSIVE METABOLIC PANEL (CC13)
ALT: 28 U/L (ref 0–55)
AST: 21 U/L (ref 5–34)
Albumin: 3.6 g/dL (ref 3.5–5.0)
BUN: 14.6 mg/dL (ref 7.0–26.0)
Calcium: 9.9 mg/dL (ref 8.4–10.4)
Chloride: 99 mEq/L (ref 98–109)
Potassium: 4.9 mEq/L (ref 3.5–5.1)

## 2012-09-27 NOTE — Progress Notes (Signed)
Melody Tran [id number] 1929-01-15 79 y.o. 09/27/2012 3:37 PM  CC  [name], MD [address], [address] [place] [address] Dr. Claud Kelp Dr. Dorothy Puffer  REASON FOR CONSULTATION:  79 year old female with new diagnosis of breast cancer of the right breast. Patient is seen at multidisciplinary breast clinic for discussion of treatment options.  STAGE:   Breast cancer of upper-outer quadrant of right female breast   Primary site: Breast (Right)   Staging method: AJCC 7th Edition   Clinical: Stage IIA (T2, N0, cM0)   Summary: Stage IIA (T2, N0, cM0)  REFERRING PHYSICIAN: Dr. Claud Kelp  HISTORY OF PRESENT ILLNESS:  Melody Tran is a 79 y.o. female.  Felt a lump in her right breast about a month ago. Her last mammogram was about 2 years ago. Patient had a mammogram and right breast ultrasound performed on 09/07/2012. This revealed a 3.3 x 2.2 x 1.6 cm mass with calcifications in the upper outer quadrant of the right breast. Ultrasound was poorly defined and stated to be at 938 cm from the nipple and to be 2.2 cm. The right axilla looked normal by ultrasound. Patient had image guided biopsy performed that showed a receptor-positive, HER-2-negative invasive ductal carcinoma. MRI showed a solitary finding in the right breast measuring 3.3 cm in size extending all the way back to the pectoralis fascia. But it did not invade them muscle.patient's case was discussed at the multidisciplinary breast conference. It was felt the patient was not a good candidate for lumpectomy initially due to the size of the tumor but we would consider neoadjuvant antiestrogen therapy to downsize the tumor. Patient however was not interested in neoadjuvant antiestrogen therapy and she wanted to proceed with her definitive surgery. She is without any complaints. She was also seen by Dr. Claud Kelp as well as Dr. Dorothy Puffer.   Past Medical History: Past Medical History   Diagnosis Date  . Hypertension   . Anxiety   . GERD (gastroesophageal reflux disease)   . Diabetes mellitus without complication   . Osteoporosis   . Arthritis     Past Surgical History: Past Surgical History  Procedure Laterality Date  . Breast surgery    . Eye surgery    . Cholecystectomy    . Appendectomy    . Tonsillectomy  1937  . Knee surgery  2005    Family History: Family History  Problem Relation Age of Onset  . Anemia Mother   . Breast cancer Daughter   . Breast cancer Cousin     Social History History  Substance Use Topics  . Smoking status: Never Smoker   . Smokeless tobacco: Not on file  . Alcohol Use: No    Allergies: No Known Allergies  Current Medications: Current Outpatient Prescriptions  Medication Sig Dispense Refill  . ALPRAZolam (XANAX) 0.5 MG tablet Take 0.25 mg by mouth at bedtime as needed.      . Artificial Tear Solution (TEARS NATURALE OP) Apply to eye 4 (four) times daily. For Rosacea of eyes      . Biotin 1000 MCG tablet Take 1,000 mcg by mouth daily.      . Calcium Carbonate-Vitamin D (CALCIUM 600/VITAMIN D) 600-400 MG-UNIT per tablet Take 1 tablet by mouth 2 (two) times daily. Calcium 400mg  with 500iu of Vitamin D      . cholecalciferol (VITAMIN D) 1000 UNITS tablet Take 1,000 Units by mouth daily.      . Clindamycin Phosphate POWD Apply  topically as needed (lotion, as needed for rosacea).      . Coenzyme Q10 (CO Q10) 100 MG CAPS Take by mouth.      Marland Kitchen ibuprofen (ADVIL,MOTRIN) 200 MG tablet Take 200 mg by mouth every 6 (six) hours as needed for pain.      Boris Lown Oil 300 MG CAPS Take 353 mg by mouth.      Marland Kitchen lisinopril (PRINIVIL,ZESTRIL) 5 MG tablet Take by mouth daily with supper.      . loratadine (CLARITIN) 10 MG tablet Take 10 mg by mouth daily. Alavert-Antihistamine      . Magnesium 250 MG TABS Take by mouth daily.      . ranitidine (ZANTAC) 75 MG tablet Take 75 mg by mouth as needed for heartburn.      Marland Kitchen atenolol (TENORMIN) 25  MG tablet Take 25 mg by mouth daily.       No current facility-administered medications for this visit.    OB/GYN History:menarche at age 66 she underwent menopause at age 37. She was on hormone replacement therapy for 10 years she discontinued in 1995. She is at 56 term births with first live birth at 69.  Fertility Discussion:not applicable Prior History of Cancer:no  Health Maintenance:  Colonoscopys yes Bone Density yes Last PAP smear unknown  ECOG PERFORMANCE STATUS: 1 - Symptomatic but completely ambulatory  Genetic Counseling/testing: no  REVIEW OF SYSTEMS:  A comprehensive review of systems was negative.  PHYSICAL EXAMINATION: Blood pressure 138/81, pulse 91, temperature 97.8 F (36.6 C), temperature source Oral, resp. rate 20, height 5\' 6"  (1.676 m), weight 110 lb 1.6 oz (49.941 kg).  Patient is awake alert in no acute distress breasts well-developed female HEENT exam EOMI PERRLA sclerae anicteric no conjunctival pallor oral mucosa is moist neck is supple lungs clear cardiovascular regular rate rhythm abdomen soft nontender no HSM extremities no edema neuro patient's alert oriented otherwise nonfocal right breast reveals palpable mass left breast no masses or nipple discharge     STUDIES/RESULTS: US Breast Right  09/07/2012   *RADIOLOGY REPORT*  Clinical Data:  Mass felt by the patient in the upper outer right breast for the past 2-3 weeks.  Status post right breast benign excisional biopsy in 1979.  DIGITAL DIAGNOSTIC BILATERAL MAMMOGRAM WITH CAD AND RIGHT BREAST ULTRASOUND:  Comparison:  Previous examinations.  Findings:  ACR Breast Density Category b:  There are scattered areas of fibroglandular density.  There is an irregular area of architectural distortion and increased density in the upper outer right breast corresponding to the mass felt by the patient, marked with a metallic marker.  This area measures approximately 3.3 x 2.2 x 1.6 cm in maximum dimensions with  some small microcalcifications in that region.  No findings elsewhere in either breast suspicious for malignancy.  No abnormal appearing lymph nodes.  Mammographic images were processed with CAD.  On physical exam, the patient has an approximately 7 cm the round, hard, palpable, protruding mass in the upper outer quadrant of the right breast.  No palpable right axillary lymph nodes.  Ultrasound is performed, showing a poorly defined, irregularly marginated, hypoechoic mass exhibiting areas of posterior acoustical shadowing in the 9:30 o'clock position of the right breast, 8 cm from the nipple.  This measures approximately 2.2 x 1.6 x 1.4 cm in maximum dimensions sonographically.  Normal appearing right axillary lymph nodes.  IMPRESSION: Palpable mass in the 9:30 o'clock position of the right breast measuring approximately 3.3 cm in maximum diameter mammographically  and 2.2 cm in maximum diameter sonographically.  This is highly suspicious for malignancy.  Right breast ultrasound guided core needle biopsy is recommended.  This has been discussed with the patient and scheduled at 2:00 p.m. on 09/15/2012.  RECOMMENDATION: Right breast ultrasound guided core needle biopsy (scheduled).  I have discussed the findings and recommendations with the patient. Results were also provided in writing at the conclusion of the visit.  If applicable, a reminder letter will be sent to the patient regarding the next appointment.  BI-RADS CATEGORY 5:  Highly suggestive of malignancy - appropriate action should be taken.   Original Report Authenticated By: Beckie Salts, M.D.   Mr Breast Bilateral W Wo Contrast  09/22/2012   *RADIOLOGY REPORT*  Clinical Data:  New diagnosis of invasive carcinoma upper outer quadrant right breast  LABS  BILATERAL BREAST MRI WITH AND WITHOUT CONTRAST  Technique: Multiplanar, multisequence MR images of both breasts were obtained prior to and following the intravenous administration of 10ml of multi and.   THREE-DIMENSIONAL MR IMAGE RENDERING ON INDEPENDENT WORKSTATION:  Three-dimensional MR images were rendered by post-processing of the original MR data on an independent workstation.  The three- dimensional MR images were interpreted, and findings are reported in the following complete MRI report for this study.  Comparison:   Recent imaging examinations.  FINDINGS:  Breast composition:  b.  Scattered fibroglandular tissue  Background parenchymal enhancement: Mild to moderate  Right breast:  In the posterior 10 o'clock position of the right breast, there is artifact from biopsy marker clip along the posterior edge of an irregular enhancing mass with irregular borders.  The mass measures 23 x 18 x 33 mm with the largest dimension in the cranial caudal orientation.  The mass extends back to contact the posterior pectoralis fascia with no evidence of chest wall enhancement or involvement.  Left breast:   No mass or abnormal enhancement.  Lymph nodes:   No abnormal appearing lymph nodes.  Ancillary findings:  None.  IMPRESSION: Known invasive carcinoma posterior upper outer quadrant right breast.  No other suspicious findings.  BI-RADS CATEGORY 6:  Known biopsy-proven malignancy - appropriate action should be taken.   Original Report Authenticated By: Esperanza Heir, M.D.   Mm Digital Diagnostic Bilat  09/07/2012   *RADIOLOGY REPORT*  Clinical Data:  Mass felt by the patient in the upper outer right breast for the past 2-3 weeks.  Status post right breast benign excisional biopsy in 1979.  DIGITAL DIAGNOSTIC BILATERAL MAMMOGRAM WITH CAD AND RIGHT BREAST ULTRASOUND:  Comparison:  Previous examinations.  Findings:  ACR Breast Density Category b:  There are scattered areas of fibroglandular density.  There is an irregular area of architectural distortion and increased density in the upper outer right breast corresponding to the mass felt by the patient, marked with a metallic marker.  This area measures approximately 3.3  x 2.2 x 1.6 cm in maximum dimensions with some small microcalcifications in that region.  No findings elsewhere in either breast suspicious for malignancy.  No abnormal appearing lymph nodes.  Mammographic images were processed with CAD.  On physical exam, the patient has an approximately 7 cm the round, hard, palpable, protruding mass in the upper outer quadrant of the right breast.  No palpable right axillary lymph nodes.  Ultrasound is performed, showing a poorly defined, irregularly marginated, hypoechoic mass exhibiting areas of posterior acoustical shadowing in the 9:30 o'clock position of the right breast, 8 cm from the nipple.  This measures approximately  2.2 x 1.6 x 1.4 cm in maximum dimensions sonographically.  Normal appearing right axillary lymph nodes.  IMPRESSION: Palpable mass in the 9:30 o'clock position of the right breast measuring approximately 3.3 cm in maximum diameter mammographically and 2.2 cm in maximum diameter sonographically.  This is highly suspicious for malignancy.  Right breast ultrasound guided core needle biopsy is recommended.  This has been discussed with the patient and scheduled at 2:00 p.m. on 09/15/2012.  RECOMMENDATION: Right breast ultrasound guided core needle biopsy (scheduled).  I have discussed the findings and recommendations with the patient. Results were also provided in writing at the conclusion of the visit.  If applicable, a reminder letter will be sent to the patient regarding the next appointment.  BI-RADS CATEGORY 5:  Highly suggestive of malignancy - appropriate action should be taken.   Original Report Authenticated By: Beckie Salts, M.D.   Mm Digital Diagnostic Unilat R  09/15/2012   *RADIOLOGY REPORT*  Clinical Data:  Status post ultrasound-guided core biopsy of a suspicious right breast mass.  DIGITAL DIAGNOSTIC RIGHT MAMMOGRAM  Comparison:  Previous exams.  Findings:  Films are performed following ultrasound guided biopsy of a mass in the 9:30 region of  the right breast.  Mammographic images showed there is a ribbon shaped clip in the upper outer quadrant of the right breast.  IMPRESSION: Status post ultrasound-guided core biopsy of the right breast with pathology pending.   Original Report Authenticated By: Baird Lyons, M.D.   [place] Rt Breast Bx W Loc Dev 1st Lesion Img Bx Spec US Guide  09/18/2012   **ADDENDUM** CREATED: 09/18/2012 10:54:04  Invasive ductal carcinoma was reported histologically.  This corresponds well with the imaging findings.  The patient was contacted by telephone on 09/18/2012 and given the results of the biopsy.  She states the wound site is clean and dry with no hematoma or signs of infection.  The patient will be seen at the Multidisciplinary Clinic on 09/27/2012.  Breast MRI will be scheduled at the patient's convenience.  **END ADDENDUM** SIGNED BY: Dina L. Judyann Munson, M.D.  09/15/2012   *RADIOLOGY REPORT*  Clinical Data:  Suspicious right breast mass  ULTRASOUND GUIDED VACUUM ASSISTED CORE BIOPSY OF THE RIGHT BREAST  Comparison: Previous exams.  I met with the patient and we discussed the procedure of ultrasound- guided biopsy, including benefits and alternatives.  We discussed the high likelihood of a successful procedure. We discussed the risks of the procedure including infection, bleeding, tissue injury, clip migration, and inadequate sampling.  Informed written consent was given. The usual time-out protocol was performed immediately prior to the procedure.  Using sterile technique and 2% Lidocaine as local anesthetic, under direct ultrasound visualization, a 12 gauge vacuum-assisted device was used to perform biopsy of the mass in the 9:30 region of the right breast using an inferior approach. At the conclusion of the procedure, a  tissue marker clip was deployed into the biopsy cavity.  Follow-up 2-view mammogram was performed and dictated separately.  IMPRESSION: Ultrasound-guided biopsy of the right breast.  No apparent complications.    Original Report Authenticated By: Baird Lyons, M.D.     LABS:    Chemistry      Component Value Date/Time   NA 135* 09/27/2012 1222   K 4.9 09/27/2012 1222   CO2 29 09/27/2012 1222   BUN 14.6 09/27/2012 1222   CREATININE 0.8 09/27/2012 1222      Component Value Date/Time   CALCIUM 9.9 09/27/2012 1222   ALKPHOS 75 09/27/2012  1222   AST 21 09/27/2012 1222   ALT 28 09/27/2012 1222   BILITOT 0.88 09/27/2012 1222      Lab Results  Component Value Date   WBC 10.0 09/27/2012   HGB 12.6 09/27/2012   HCT 37.1 09/27/2012   MCV 89.0 09/27/2012   PLT 308 09/27/2012   PATHOLOGY: ADDITIONAL INFORMATION: PROGNOSTIC INDICATORS - ACIS Results: IMMUNOHISTOCHEMICAL AND MORPHOMETRIC ANALYSIS BY THE AUTOMATED CELLULAR IMAGING SYSTEM (ACIS) Estrogen Receptor: 100%, POSITIVE, STRONG STAINING INTENSITY Progesterone Receptor: 98%, POSITIVE, STRONG STAINING INTENSITY Proliferation Marker Ki67: 13% REFERENCE RANGE ESTROGEN RECEPTOR NEGATIVE <1% POSITIVE =>1% PROGESTERONE RECEPTOR NEGATIVE <1% POSITIVE =>1% All controls stained appropriately Pecola Leisure MD Pathologist, Electronic Signature ( Signed 09/20/2012) CHROMOGENIC IN-SITU HYBRIDIZATION Results: HER-2/NEU BY CISH - NO AMPLIFICATION OF HER-2 DETECTED. RESULT RATIO OF HER2: CEP 17 SIGNALS 0.81 AVERAGE HER2 COPY NUMBER PER CELL 1.50 REFERENCE RANGE 1 of 3 Duplicate copy FINAL for [place], Dedria E ([id number]) ADDITIONAL INFORMATION:(continued) NEGATIVE HER2/Chr17 Ratio <2.0 and Average HER2 copy number <4.0 EQUIVOCAL HER2/Chr17 Ratio <2.0 and Average HER2 copy number 4.0 and <6.0 POSITIVE HER2/Chr17 Ratio >=2.0 and/or Average HER2 copy number >=6.0 Pecola Leisure MD Pathologist, Electronic Signature ( Signed 09/20/2012) FINAL DIAGNOSIS Diagnosis Breast, right, needle core biopsy, mass, UOQ - INVASIVE DUCTAL CARCINOMA, SEE COMMENT. Microscopic Comment Although the grade of tumor is best assessed at resection, with these biopsies,  invasive tumor is grade I-II. Breast prognostic studies are pending and will be reported in an addendum. The case was reviewed with Dr. Colonel Bald who concurs. (CR:kh 09-18-12) [place] RUND DO Pathologist, Electronic Signature (Case signed 09/18/2012)  ASSESSMENT    79 year old female with new diagnosis of  #1 invasive ductal carcinoma of the right breast upper outer quadrant ER/PR positive HER-2/neu negative. Patient's tumor on MRI extended back to the pectoralis fascia. She was recommended neoadjuvant therapy however she declined this. She would like to proceed with a mastectomy and sentinel lymph node biopsy.  #2 history of breast cancer in daughter and several cousins.  #3 esophageal stricture and gastroesophageal reflux disease.  #4 chronic anxiety.  Clinical Trial Eligibility: no  Multidisciplinary conference discussion yes     PLAN:    #1 patient will proceed with right total mastectomy and sentinel lymph node biopsy.  #2 after her surgery I will plan on seeing her back for discussion of treatment options adjuvantly.        Discussion: Patient is being treated per NCCN breast cancer care guidelines appropriate for stage.II   Thank you so much for allowing me to participate in the care of Melody Tran. I will continue to follow up the patient with you and assist in her care.  All questions were answered. The patient knows to call the clinic with any problems, questions or concerns. We can certainly see the patient much sooner if necessary.  I spent 55 minutes counseling the patient face to face. The total time spent in the appointment was 60 minutes.  Drue Second, MD Medical/Oncology [facility] [phone] (beeper) [phone] (Office)  09/27/2012, 3:37 PM

## 2012-09-27 NOTE — Progress Notes (Signed)
Patient ID: Melody Tran, female   DOB: 07-09-1928, 79 y.o.   MRN: [id number]  No chief complaint on file.   HPI Melody Tran is a 79 y.o. female.  She is referred by Dr. Roswell Nickel at the [facility]  For managemengt of a newly diagnosed cancer of the lateral aspect of the right breast. Her primary care physician is Dr. Maurice Small. She is being evaluated in the [facility]  today by Dr. Welton Flakes, Dr. Dorothy Puffer, and me.  And she felt a lump in her right breast about 1 month ago gave her last mammogram was 2 years ago.  Mammogram and right breast ultrasound were performed on 09/07/2012. This revealed a 3.3 x 2.2 x 1.6 cm mass with calcifications in the upper outer quadrant of the right breast. The ultrasound this was poorly defined and stated to be at 9:30, 8 cm from the nipple and to be 2.2 cm. The right axilla looked normal by ultrasound. Image guided biopsy showed a receptor positive, HER-2-negative invasive ductal carcinoma. Subsequent MRI showed a solitary finding in the right breast being 3.3 cm in size extending all the way back to the pectoralis fascia, but did not appear to be invading the muscle. The left breast looked normal. There was no adenopathy.  We have discussed her care in breast conference this morning and again prior to the clinic. The 3 of [place] concurred that she is not a good candidate for lumpectomy at this time due to the size of the tumor, But that we could consider neoadjuvant antiestrogen therapy to downsize the tumor.The patient is not interested in neoadjuvant antiestrogen  therapy and requested we proceed with definitive surgery as the next step in the near future.  Past history is significant for open appendectomy, open cholecystectomy, right breast biopsy for benign disease 1979, hypertension, anxiety, GERD, diabetes.  Family history reveals her daughter was diagnosed with breast cancer at age 79. 1 aunt and 4 cousins have breast cancer. No one has had genetic testing.Marland Kitchen  HPI  Past  Medical History  Diagnosis Date  . Hypertension   . Anxiety   . GERD (gastroesophageal reflux disease)   . Diabetes mellitus without complication   . Osteoporosis   . Arthritis     Past Surgical History  Procedure Laterality Date  . Breast surgery    . Eye surgery    . Cholecystectomy    . Appendectomy    . Tonsillectomy  1937  . Knee surgery  2005    Family History  Problem Relation Age of Onset  . Anemia Mother   . Breast cancer Daughter   . Breast cancer Cousin     Social History History  Substance Use Topics  . Smoking status: Never Smoker   . Smokeless tobacco: Not on file  . Alcohol Use: No    No Known Allergies  Current Outpatient Prescriptions  Medication Sig Dispense Refill  . ALPRAZolam (XANAX) 0.5 MG tablet Take 0.25 mg by mouth at bedtime as needed.      . Artificial Tear Solution (TEARS NATURALE OP) Apply to eye 4 (four) times daily. For Rosacea of eyes      . atenolol (TENORMIN) 25 MG tablet Take 25 mg by mouth daily.      . Biotin 1000 MCG tablet Take 1,000 mcg by mouth daily.      . Calcium Carbonate-Vitamin D (CALCIUM 600/VITAMIN D) 600-400 MG-UNIT per tablet Take 1 tablet by mouth 2 (two) times daily. Calcium 400mg  with  500iu of Vitamin D      . cholecalciferol (VITAMIN D) 1000 UNITS tablet Take 1,000 Units by mouth daily.      . Clindamycin Phosphate POWD Apply topically as needed (lotion, as needed for rosacea).      . Coenzyme Q10 (CO Q10) 100 MG CAPS Take by mouth.      Marland Kitchen ibuprofen (ADVIL,MOTRIN) 200 MG tablet Take 200 mg by mouth every 6 (six) hours as needed for pain.      Boris Lown Oil 300 MG CAPS Take 353 mg by mouth.      Marland Kitchen lisinopril (PRINIVIL,ZESTRIL) 5 MG tablet Take by mouth daily with supper.      . loratadine (CLARITIN) 10 MG tablet Take 10 mg by mouth daily. Alavert-Antihistamine      . Magnesium 250 MG TABS Take by mouth daily.      . ranitidine (ZANTAC) 75 MG tablet Take 75 mg by mouth as needed for heartburn.       No current  facility-administered medications for this visit.    Review of Systems Review of Systems  Constitutional: Positive for unexpected weight change. Negative for fever and chills.  HENT: Negative for hearing loss, congestion, sore throat, trouble swallowing and voice change.        Difficulty swallowing. Esophageal stenosis.  Eyes: Negative for visual disturbance.  Respiratory: Negative for cough and wheezing.   Cardiovascular: Negative for chest pain, palpitations and leg swelling.  Gastrointestinal: Negative for nausea, vomiting, abdominal pain, diarrhea, constipation, blood in stool, abdominal distention and anal bleeding.  Genitourinary: Negative for hematuria, vaginal bleeding and difficulty urinating.  Musculoskeletal: Positive for myalgias and arthralgias.  Skin: Negative for rash and wound.  Neurological: Negative for seizures, syncope and headaches.  Hematological: Negative for adenopathy. Does not bruise/bleed easily.  Psychiatric/Behavioral: Negative for confusion. The patient is nervous/anxious.     There were no vitals taken for this visit.  Physical Exam Physical Exam  Constitutional: She is oriented to person, place, and time. She appears well-developed and well-nourished. No distress.  HENT:  Head: Normocephalic and atraumatic.  Nose: Nose normal.  Mouth/Throat: No oropharyngeal exudate.  Eyes: Conjunctivae and EOM are normal. Pupils are equal, round, and reactive to light. Left eye exhibits no discharge. No scleral icterus.  Neck: Neck supple. No JVD present. No tracheal deviation present. No thyromegaly present.  Cardiovascular: Normal rate, regular rhythm, normal heart sounds and intact distal pulses.   No murmur heard. Pulmonary/Chest: Effort normal and breath sounds normal. No respiratory distress. She has no wheezes. She has no rales. She exhibits no tenderness.    Transverse scar right breast, 12:00 position. A 4 cm mass right breast, upper outer quadrant coming  close to the areolar margin but not involving skin. Mobile. No other masses in either breast. No axillary adenopathy.  Abdominal: Soft. Bowel sounds are normal. She exhibits no distension and no mass. There is no tenderness. There is no rebound and no guarding.    Right subcostal scar and lower midline scar well healed.  Musculoskeletal: She exhibits no edema and no tenderness.  Lymphadenopathy:    She has no cervical adenopathy.  Neurological: She is alert and oriented to person, place, and time. She exhibits normal muscle tone. Coordination normal.  Skin: Skin is warm. No rash noted. She is not diaphoretic. No erythema. No pallor.  Psychiatric: She has a normal mood and affect. Her behavior is normal. Judgment and thought content normal.    Data Reviewed Imaging studies and  histology slides. I have reviewed her case in conferences morning. Contact coordinate her care plan with Dr. Drue Second and Dr. Dorothy Puffer.  Assessment    Invasive ductal carcinoma right breast, upper outer quadrant, receptor positive, HER-2-negative, extending back to the pectoralis fascia.  Positive family history of breast cancer and daughter, and, and several cousins.  GERD and esophageal stricture  Hypertension  Chronic anxiety  Prediabetes  History of open appendectomy  History open cholecystectomy     Plan    We discussed the options of neoadjuvant antiestrogen therapy to downstage his tumor followed by lumpectomy and sentinel node biopsy. We discussed the option of right total mastectomy and sentinel node biopsy. She strongly prefers proceeding with definitive surgery which will require a mastectomy at this time due to the size of the tumor  She will be scheduled for right total mastectomy with sentinel lymph node biopsy. She may or may not need postop radiation therapy.  She will be offered antiestrogen therapy postop.  She'll be referred for genetic counseling and testing at a later  date  I discussed the indications, details, techniques, and numerous risks of her surgery. She's aware of  the risk of bleeding, infection, skin necrosis, arm swelling, arm numbness, frozen shoulder, and other unforseen problems. She understands these issues well and she has had all of her questions are answered. She is in full agreement with this plan.        Angelia Mould. Derrell Lolling, M.D., [facility] General and Minimally invasive Surgery Breast and Colorectal Surgery Office:   [phone] Pager:   [phone]  09/27/2012, 3:31 PM

## 2012-09-27 NOTE — Patient Instructions (Addendum)
Proceed with mastectomy  I will see you back in 4-5 weeks

## 2012-09-27 NOTE — Patient Instructions (Signed)
You have been diagnosed with a fairly large cancer of the lateral aspect of the right breast. This extends all the way back to the muscle, but does not appear to be invading the muscle. Your lymph nodes did not appear to be enlarged.  We have discussed different options for treatment, and you have stated that you would like to go ahead with a mastectomy at this time. That is an appropriate option  You will be scheduled for right total mastectomy with sentinel lymph node biopsy in the near future     Mastectomy, With or Without Reconstruction Mastectomy (removal of the breast) is a procedure most commonly used to treat cancer (tumor) of the breast. Different procedures are available for treatment. This depends on the stage of the tumor (abnormal growths). Discuss this with your caregiver, surgeon (a specialist for performing operations such as this), or oncologist (someone specialized in the treatment of cancer). With proper information, you can decide which treatment is best for you. Although the sound of the word cancer is frightening to all of [place], the new treatments and medications can be a source of reassurance and comfort. If there are things you are worried about, discuss them with your caregiver. He or she can help comfort you and your family. Some of the different procedures for treating breast cancer are:  Radical (extensive) mastectomy. This is an operation used to remove the entire breast, the muscles under the breast, and all of the glands (lymph nodes) under the arm. With all of the new treatments available for cancer of the breast, this procedure has become less common.  Modified radical mastectomy. This is a similar operation to the radical mastectomy described above. In the modified radical mastectomy, the muscles of the chest wall are not removed unless one of the lessor muscles is removed. One of the lessor muscles may be removed to allow better removal of the lymph nodes. The axillary  lymph nodes are also removed. Rarely, during an axillary node dissection nerves to this area are damaged. Radiation therapy is then often used to the area following this surgery.  A total mastectomy also known as a complete or simple mastectomy. It involves removal of only the breast. The lymph nodes and the muscles are left in place.  In a lumpectomy, the lump is removed from the breast. This is the simplest form of surgical treatment. A sentinel lymph node biopsy may also be done. Additional treatment may be required. RISKS AND COMPLICATIONS The main problems that follow removal of the breast include:  Infection (germs start growing in the wound). This can usually be treated with antibiotics (medications that kill germs).  Lymphedema. This means the arm on the side of the breast that was operated on swells because the lymph (tissue fluid) cannot follow the main channels back into the body. This only occurs when the lymph nodes have had to be removed under the arm.  There may be some areas of numbness to the upper arm and around the incision (cut by the surgeon) in the breast. This happens because of the cutting of or damage to some of the nerves in the area. This is most often unavoidable.  There may be difficulty moving the arm in a full range of motion (moving in all directions) following surgery. This usually improves with time following use and exercise.  Recurrence of breast cancer may happen with the very best of surgery and follow up treatment. Sometimes small cancer cells that cannot be seen  with the naked eye have already spread at the time of surgery. When this happens other treatment is available. This treatment may be radiation, medications or a combination of both. RECONSTRUCTION Reconstruction of the breast may be done immediately if there is not going to be post-operative radiation. This surgery is done for cosmetic (improve appearance) purposes to improve the physical appearance  after the operation. This may be done in two ways:  It can be done using a saline filled prosthetic (an artificial breast which is filled with salt water). Silicone breast implants are now re-approved by the FDA and are being commonly used.  Reconstruction can be done using the body's own muscle/fat/skin. Your caregiver will discuss your options with you. Depending upon your needs or choice, together you will be able to determine which procedure is best for you. Document Released: 11/17/2000 Document Revised: 11/17/2011 Document Reviewed: 07/11/2007 [facility] Patient Information 2014 [place], [place].

## 2012-09-27 NOTE — Progress Notes (Signed)
Checked in new pt with no financial concerns. °

## 2012-09-28 ENCOUNTER — Encounter: Payer: Medicare Other | Admitting: Genetic Counselor

## 2012-09-28 ENCOUNTER — Other Ambulatory Visit: Payer: Medicare Other | Admitting: Lab

## 2012-10-03 NOTE — Progress Notes (Signed)
Radiation Oncology [phone]  ________________________________  Name: Marvene Staff MRN: [id number]  Date: 09/27/2012 DOB: 12/07/1939   WU:[id number], ALEXEI, MD Ernestene Mention, MD Drue Second, MD   REFERRING PHYSICIAN: Ernestene Mention, MD   DIAGNOSIS: There were no encounter diagnoses.   HISTORY OF PRESENT ILLNESS::Ernestine Lacretia Nicks Nino Parsley is a 79 y.o. female who is seen for an initial consultation visit. The patient indicates that she and a low her right breast approximately 1 month ago. A mammogram and right ultrasound were performed. Calcifications were seen within the upper outer quadrant of the right breast and ultrasound revealed a 2.2 cm mass by 3.3 x 1.6 cm. No suspicious axillary lymph nodes were seen by ultrasound. An image guided biopsy was performed and this returned positive for invasive ductal carcinoma which appeared to represent a grade 1/2 tumor. Receptor studies were performed and they returned positive for estrogen and progesterone receptor, HER-2/neu negative. The Ki-67 staining was 13%. An MRI scan of the breasts bilaterally has been performed. This showed a solitary finding corresponding to the known invasive carcinoma measuring 3.3 cm. No abnormal appearing lymph nodes were present.  The patient's case was discussed in multidisciplinary breast conference. She was felt to potentially be a candidate for neoadjuvant hormonal treatment because of the location of the tumor if she is interested in breast conservation treatment. Otherwise a mastectomy could be performed.   PREVIOUS RADIATION THERAPY: No   PAST MEDICAL HISTORY: has a past medical history of Hypertension; Thyroid disease; and Arthritis.   PAST SURGICAL HISTORY:  Past Surgical History   Procedure  Laterality  Date   .  Abdominal hysterectomy      FAMILY HISTORY: family history includes Breast cancer in her sisters and Ovarian cancer in her sister.   SOCIAL HISTORY: reports that she quit smoking about  25 years ago. Her smoking use included Cigarettes. She has a 20 pack-year smoking history. She has never used smokeless tobacco. She reports that she does not drink alcohol or use illicit drugs.   ALLERGIES: Review of patient's allergies indicates no known allergies.   MEDICATIONS:  Current Outpatient Prescriptions   Medication  Sig  Dispense  Refill   .  aspirin 325 MG tablet  Take 325 mg by mouth daily.     Marland Kitchen  levothyroxine (SYNTHROID, LEVOTHROID) 75 MCG tablet  Take 75 mcg by mouth daily before breakfast.     .  lisinopril-hydrochlorothiazide (PRINZIDE,ZESTORETIC) 10-12.5 MG per tablet  Take 1 tablet by mouth daily.     .  rosuvastatin (CRESTOR) 10 MG tablet  Take 10 mg by mouth daily.      No current facility-administered medications for this encounter.    REVIEW OF SYSTEMS: A 15 point review of systems is documented in the electronic medical record. This was obtained by the nursing staff. However, I reviewed this with the patient to discuss relevant findings and make appropriate changes. Pertinent items are noted in HPI.   PHYSICAL EXAM: vitals were not taken for this visit.  General: Well-developed, in no acute distress  HEENT: Normocephalic, atraumatic; oral cavity clear  Neck: Supple without any lymphadenopathy  Cardiovascular: Regular rate and rhythm  Respiratory: Clear to auscultation bilaterally  Breasts: A mass is felt in the upper outer quadrant of the right breast measuring approximately 3-4 cm. No skin involvement. No axillary adenopathy present. Left breast exam unremarkable and no axillary adenopathy on this side  GI: Soft, nontender, normal bowel sounds  Extremities: No edema present  Neuro:  No focal deficits   LABORATORY DATA:  Lab Results   Component  Value  Date    WBC  5.7  09/27/2012    HGB  13.0  09/27/2012    HCT  37.3  09/27/2012    MCV  94.5  09/27/2012    PLT  144*  09/27/2012    Lab Results   Component  Value  Date    NA  133*  09/27/2012    K  3.8   09/27/2012    CO2  31*  09/27/2012    Lab Results   Component  Value  Date    ALT  15  09/27/2012    AST  29  09/27/2012    ALKPHOS  59  09/27/2012    BILITOT  0.88  09/27/2012    RADIOGRAPHY: Mr Breast Bilateral W Wo Contrast  09/26/2012 *RADIOLOGY REPORT* Clinical Data: 79 year old female with new diagnosis left breast DCIS. BUN and creatinine were obtained on site at [facility] at [address] Results: BUN 12 mg/dL, Creatinine 0.8 mg/dL. BILATERAL BREAST MRI WITH AND WITHOUT CONTRAST Technique: Multiplanar, multisequence MR images of both breasts were obtained prior to and following the intravenous administration of 8ml of Multihance. THREE-DIMENSIONAL MR IMAGE RENDERING ON INDEPENDENT WORKSTATION: Three-dimensional MR images were rendered by post-processing of the original MR data on an independent workstation. The three- dimensional MR images were interpreted, and findings are reported in the following complete MRI report for this study. Comparison: Priors including 09/15/2012. FINDINGS: Breast composition: c: Heterogeneous fibroglandular tissue Background parenchymal enhancement: Mild Right breast: No mass or abnormal enhancement. Left breast: Biopsy marking clip is demonstrated within the left breast 6 o'clock position anterior depth. There is a 2.0 x 1.0 x 1.3 cm post biopsy cavity within the 6 o'clock position anterior depth. There is a rind of irregular enhancement surrounding the biopsy cavity which may represent post biopsy change versus residual tumor. There is a small amount of regional increased T2 signal within the inferior aspect of the left breast, likely secondary to post biopsy changes. Superior and lateral to the biopsy cavity in the approximate 4 o'clock position there is linear non-mass enhancement extending from the anterior breast to the level of the pectoralis muscle measuring 3.1 cm in AP x 1.0 cm in transverse x 0.7 cm in craniocaudal dimension (image [id number]). Lymph  nodes: No abnormal appearing lymph nodes. Ancillary findings: Within the right lung there are a few nonspecific T2 bright foci. Within the right paramediastinal region there is an 8 mm rounded T2 focus (image 40). Additional 5 mm T2 bright foci are demonstrated within the right lung (image 10 and image 20). IMPRESSION: 1. Biopsy cavity within the left breast 6 o'clock position with surrounding enhancement likely representing post biopsy changes. 2. Along the superior lateral aspect of the left breast biopsy cavity extending approximately 3.1 cm in the AP dimension at the 4 o'clock position of the left breast is linear non-mass enhancement. If breast conservation therapy is being considered, MRI guided core biopsy of the posterior extent of this linear non-mass enhancement can be obtained for further characterization. 3. Multiple nonspecific T2 bright foci within the right lung. This may correlated with dedicated chest CT as clinically warranted. RECOMMENDATION: 1. Appropriate action should be taken for the biopsy-proven left breast DCIS. 2. Suspicious linear non-mass enhancement within the 4 o'clock position left breast. If breast conservation is desired, MR guided core biopsy of the posterior extent of the left breast linear non  mass enhancement is recommended. 3. Consider dedicated chest CT for further evaluation of T2 bright foci within the right lung as clinically indicated. BI-RADS CATEGORY 4: Suspicious abnormality - biopsy should be considered. Original Report Authenticated By: Annia Belt, M.D    IMPRESSION: The patient clinically is presenting with a T2, N0, M0 invasive ductal carcinoma of the right breast. She is felt to be a potential candidate for neoadjuvant hormonal treatment to facilitate breast conservation treatment. However, the patient is interested in proceeding with a mastectomy in the near future. She does have a family history which I believe concerns her and placed into her followup  process.  Based on the patient's presentation, I do not anticipate a role for postmastectomy radiation treatment. No suspicious lymph nodes were present and this represents a T2 tumor. If expected difficulty is encounter and close/positive margins were obtained, and certainly this could be explored further. I discussed the likelihood that the patient will not need radiotherapy if she proceeds with a mastectomy and the reasoning behind this recommendation. She is aware that a final decision is always may regarding adjuvant treatment based on the most up-to-date information which will depend on the final pathology with report.   PLAN: The patient will proceed with mastectomy and sentinel lymph node evaluation. I do not anticipate at this time a need for postmastectomy radiotherapy.   I spent 60 minutes minutes face to face with the patient and more than 50% of that time was spent in counseling and/or coordination of care.  ________________________________   Radene Gunning, MD, PhD

## 2012-10-06 ENCOUNTER — Telehealth: Payer: Self-pay | Admitting: *Deleted

## 2012-10-06 NOTE — Telephone Encounter (Signed)
Called and spoke with patient from [facility] 09/27/12.  Patient denies questions of concerns at this time.  Contact information given.

## 2012-10-23 ENCOUNTER — Encounter (HOSPITAL_COMMUNITY): Payer: Self-pay

## 2012-10-23 ENCOUNTER — Encounter (HOSPITAL_COMMUNITY)
Admission: RE | Admit: 2012-10-23 | Discharge: 2012-10-23 | Disposition: A | Discharge status: Home / Self Care | Payer: Medicare Other | Source: Ambulatory Visit | Attending: General Surgery | Admitting: General Surgery

## 2012-10-23 DIAGNOSIS — Z01812 Encounter for preprocedural laboratory examination: Secondary | ICD-10-CM | POA: Insufficient documentation

## 2012-10-23 DIAGNOSIS — Z01810 Encounter for preprocedural cardiovascular examination: Secondary | ICD-10-CM | POA: Insufficient documentation

## 2012-10-23 DIAGNOSIS — I10 Essential (primary) hypertension: Secondary | ICD-10-CM | POA: Insufficient documentation

## 2012-10-23 DIAGNOSIS — C50919 Malignant neoplasm of unspecified site of unspecified female breast: Secondary | ICD-10-CM | POA: Insufficient documentation

## 2012-10-23 DIAGNOSIS — I440 Atrioventricular block, first degree: Secondary | ICD-10-CM | POA: Insufficient documentation

## 2012-10-23 DIAGNOSIS — E119 Type 2 diabetes mellitus without complications: Secondary | ICD-10-CM | POA: Insufficient documentation

## 2012-10-23 LAB — COMPREHENSIVE METABOLIC PANEL
Alkaline Phosphatase: 65 U/L (ref 39–117)
BUN: 15 mg/dL (ref 6–23)
CO2: 29 mEq/L (ref 19–32)
Chloride: 95 mEq/L — ABNORMAL LOW (ref 96–112)
Creatinine, Ser: 0.8 mg/dL (ref 0.50–1.10)
GFR calc Af Amer: 77 mL/min — ABNORMAL LOW (ref >90)
GFR calc non Af Amer: 67 mL/min — ABNORMAL LOW (ref >90)
Glucose, Bld: 260 mg/dL — ABNORMAL HIGH (ref 70–99)
Potassium: 4.1 mEq/L (ref 3.5–5.1)
Total Bilirubin: 0.7 mg/dL (ref 0.3–1.2)

## 2012-10-23 LAB — URINE MICROSCOPIC-ADD ON

## 2012-10-23 LAB — URINALYSIS, ROUTINE W REFLEX MICROSCOPIC
Glucose, UA: 100 mg/dL — AB
Ketones, ur: NEGATIVE mg/dL
Protein, ur: NEGATIVE mg/dL
Urobilinogen, UA: 0.2 mg/dL (ref 0.0–1.0)

## 2012-10-23 LAB — CBC WITH DIFFERENTIAL/PLATELET
Basophils Relative: 0 % (ref 0–1)
HCT: 39.4 % (ref 36.0–46.0)
Hemoglobin: 13.7 g/dL (ref 12.0–15.0)
Lymphocytes Relative: 24 % (ref 12–46)
Lymphs Abs: 2.6 10*3/uL (ref 0.7–4.0)
MCHC: 34.8 g/dL (ref 30.0–36.0)
Monocytes Absolute: 0.5 10*3/uL (ref 0.1–1.0)
Monocytes Relative: 5 % (ref 3–12)
Neutro Abs: 7.6 10*3/uL (ref 1.7–7.7)
Neutrophils Relative %: 70 % (ref 43–77)
RBC: 4.44 MIL/uL (ref 3.87–5.11)

## 2012-10-23 MED ORDER — CHLORHEXIDINE GLUCONATE 4 % EX LIQD: 1.0000 "application " | Freq: Once | CUTANEOUS | Status: DC

## 2012-10-23 NOTE — Pre-Procedure Instructions (Signed)
Melody Tran  10/23/2012   Your procedure is scheduled on:  October 31, 2012  Report to [facility] at 7 AM.  Call this number if you have problems the morning of surgery: [phone]   Remember:   Do not eat food or drink liquids after midnight.   Take these medicines the morning of surgery with A SIP OF WATER: Artificial Tear Solution (TEARS NATURALE OP, atenolol (TENORMIN) 25 MG tablet, loratadine (CLARITIN) 10  MG tablet, ranitidine (ZANTAC) 75 MG tablet as needed   Do not wear jewelry, make-up or nail polish.  Do not wear lotions, powders, or perfumes. You may wear deodorant.  Do not shave 48 hours prior to surgery. Men may shave face and neck.  Do not bring valuables to the hospital.  [facility] is not responsible for any belongings or valuables.  Contacts, dentures or bridgework may not be worn into surgery.  Leave suitcase in the car. After surgery it may be brought to your room.  For patients admitted to the hospital, checkout time is 11:00 AM the day of  discharge.   Patients discharged the day of surgery will not be allowed to drive  home.  Name and phone number of your driver:   Special Instructions: Shower using CHG 2 nights before surgery and the night before surgery.  If you shower the day of surgery use CHG.  Use special wash - you have one bottle of CHG for all showers.  You should use approximately 1/3 of the bottle for each shower.   Please read over the following fact sheets that you were given: Pain Booklet, Coughing and Deep Breathing and Surgical Site Infection Prevention

## 2012-10-24 ENCOUNTER — Telehealth (INDEPENDENT_AMBULATORY_CARE_PROVIDER_SITE_OTHER): Payer: Self-pay

## 2012-10-24 NOTE — Telephone Encounter (Signed)
I left a message for Annice Needy, PA letting her know that we will request clearance from Dr Eldridge Dace.  I also called and let the pt know what we are doing.  I will call her back if she needs to do anything else.

## 2012-10-24 NOTE — Progress Notes (Addendum)
Anesthesia chart review:  Patient is a 79 year old female scheduled for right total mastectomy with right axillary SN biopsy on 10/31/12 by Dr. Derrell Lolling.  History includes right breast cancer, DM2 (diet controlled), HTN, anxiety, GERD, osteoporosis, arthritis.  PCP is Dr. Maurice Small.  EKG on 10/23/12 showed NSR, first degree AVB, RSR prime.  She was referred to cardiologist Dr. Eldridge Dace in February 2014 for evaluation of hypotension with exercise and chest and thigh pains with clonidine that has since been discontinued. Exercise treadmill stress test was ordered and done on 05/02/12.  Results showed no EKG changes suggestive of ischemia, normal HR recovery with normal BP response, mild 2/10 chest tightness with exercise that resolved with rest.  Notes indicate that he did not recommend further testing at that time unless her symptoms got worse.    Echo on 05/02/12 [facility]) showed LVEF 60-65%, mild MR, trivial TR.  CXR on 03/21/2012 at Dr. Jone Baseman office showed "Cardiac silhouette is normal size and shape. Ectasia and non-aneurysmal calcification of the thoracic aorta are seen. The diaphragm is low lying with slight flattening on lateral image. This may reflect the body habitus but I cannot exclude element of hyperinflation. No active pulmonary infiltrate are identified. Minimal fibrotic densities are seen in the left upper lobe. There is scoliosis with upper curve convexity to the right and lower curve convexity to the left. Minimal intravertebral disc space narrowing is seen at multiple levels. There is degenerative spondylosis."  Preoperative labs noted.  Non-fasting glucose 260.  She will get a fasting CBG on arrival.  Urine culture is pending.  I reviewed above with anesthesiologist Dr. Michelle Piper.  Recommend cardiology clearance since patient with DM history and mild chest pain with exercise stress test earlier this year.  Alisha at [facility] notified.  [facility] Short Stay  Center/Anesthesiology Phone [phone] 10/24/2012 1:52 PM  Addendum: 10/30/12 2:00 PM Dr. Eldridge Dace was notified of plans for surgery.  His staff contacted patient who denied any symptoms from a cardiac standpoint, so he felt not further cardiac testing was needed prior to surgery.

## 2012-10-24 NOTE — Telephone Encounter (Signed)
The pt is scheduled for a mastectomy on 10/31/12 by Dr Derrell Lolling but short stay is requesting cardiac clearance. The pt reported that she has had chest pain in the past. The pt has seen Dr Verneita Griffes before in the past. Please advise.

## 2012-10-25 ENCOUNTER — Telehealth (INDEPENDENT_AMBULATORY_CARE_PROVIDER_SITE_OTHER): Payer: Self-pay | Admitting: General Surgery

## 2012-10-25 ENCOUNTER — Encounter (INDEPENDENT_AMBULATORY_CARE_PROVIDER_SITE_OTHER): Payer: Self-pay | Admitting: General Surgery

## 2012-10-25 LAB — URINE CULTURE: Colony Count: 35000

## 2012-10-25 NOTE — Telephone Encounter (Signed)
Called patient to let her know that she has an UTI and we will call in Cipro 500 mg BID for 5 days into the [facility] on [address] and [address] [phone].

## 2012-10-25 NOTE — Telephone Encounter (Signed)
I called and asked if they received my request for Cardiac Clearance at Dr Hoyle Barr office.  The receptionist Olegario Messier will talk to the nurse and call me back.  She called back before I finished this note and states Dr V is on vacation and will be back Monday.  I said we need this clearance by then.  Surgery is Tuesday.  She will ask if they can put a rush on this or if another md can authorize it.  I await a call back.

## 2012-10-25 NOTE — Telephone Encounter (Signed)
Melody Tran called to let me know Dr Irish Elders nurse said another md can't sign off on the clearance and he won't be back in until Monday.  The patient's surgery is Tuesday so I will ask Dr Jacinto Halim advice.

## 2012-10-26 NOTE — Telephone Encounter (Signed)
Dr Eldridge Dace is on vacation until Monday.  Another MD at his office would not review the patient's chart and give clearance.  I called Annice Needy to see if she had reviewed this with the anesthesiologist.  She did review this with Dr Michelle Piper.  They would still like this cleared.  We will see what Dr Eldridge Dace says on Monday.  If he doesn't clear her we will have to postpone surgery.  Dr Eldridge Dace has seen her this year.

## 2012-10-30 MED ORDER — CEFAZOLIN SODIUM-DEXTROSE 2-3 GM-% IV SOLR
2.0000 g | INTRAVENOUS | Status: AC
  Administered 2012-10-31: 2 g via INTRAVENOUS
  Filled 2012-10-30: qty 50

## 2012-10-30 NOTE — H&P (Signed)
Melody Tran   MRN:  [id number]   Description: 79 year old female  Provider: Ernestene Mention, MD  Department: [facility]        Diagnoses    Breast cancer of upper-outer quadrant of right female breast    -  Primary    174.4         History and Physical   Ernestene Mention, MD   Status: Signed                           HPI Melody Tran is a 79 y.o. female.  She is referred by Dr. Roswell Nickel at the [facility]  For managemengt of a newly diagnosed cancer of the lateral aspect of the right breast. Her primary care physician is Dr. Maurice Small. She is being evaluated in the [facility]  today by Dr. Welton Flakes, Dr. Dorothy Puffer, and me.   And she felt a lump in her right breast about 1 month ago gave her last mammogram was 2 years ago.  Mammogram and right breast ultrasound were performed on 09/07/2012. This revealed a 3.3 x 2.2 x 1.6 cm mass with calcifications in the upper outer quadrant of the right breast. The ultrasound this was poorly defined and stated to be at 9:30, 8 cm from the nipple and to be 2.2 cm. The right axilla looked normal by ultrasound. Image guided biopsy showed a receptor positive, HER-2-negative invasive ductal carcinoma. Subsequent MRI showed a solitary finding in the right breast being 3.3 cm in size extending all the way back to the pectoralis fascia, but did not appear to be invading the muscle. The left breast looked normal. There was no adenopathy.   We have discussed her care in breast conference this morning and again prior to the clinic. The 3 of [place] concurred that she is not a good candidate for lumpectomy at this time due to the size of the tumor, But that we could consider neoadjuvant antiestrogen therapy to downsize the tumor.The patient is not interested in neoadjuvant antiestrogen  therapy and requested we proceed with definitive surgery as the next step in the near future.   Past history is significant for open appendectomy, open  cholecystectomy, right breast biopsy for benign disease 1979, hypertension, anxiety, GERD, diabetes.   Family history reveals her daughter was diagnosed with breast cancer at age 62. 1 aunt and 4 cousins have breast cancer. No one has had genetic testing..        Past Medical History   Diagnosis  Date   .  Hypertension     .  Anxiety     .  GERD (gastroesophageal reflux disease)     .  Diabetes mellitus without complication     .  Osteoporosis     .  Arthritis           Past Surgical History   Procedure  Laterality  Date   .  Breast surgery       .  Eye surgery       .  Cholecystectomy       .  Appendectomy       .  Tonsillectomy    1937   .  Knee surgery    2005         Family History   Problem  Relation  Age of Onset   .  Anemia  Mother     .  Breast cancer  Daughter     .  Breast cancer  Cousin          Social History History   Substance Use Topics   .  Smoking status:  Never Smoker    .  Smokeless tobacco:  Not on file   .  Alcohol Use:  No        No Known Allergies    Current Outpatient Prescriptions   Medication  Sig  Dispense  Refill   .  ALPRAZolam (XANAX) 0.5 MG tablet  Take 0.25 mg by mouth at bedtime as needed.         .  Artificial Tear Solution (TEARS NATURALE OP)  Apply to eye 4 (four) times daily. For Rosacea of eyes         .  atenolol (TENORMIN) 25 MG tablet  Take 25 mg by mouth daily.         .  Biotin 1000 MCG tablet  Take 1,000 mcg by mouth daily.         .  Calcium Carbonate-Vitamin D (CALCIUM 600/VITAMIN D) 600-400 MG-UNIT per tablet  Take 1 tablet by mouth 2 (two) times daily. Calcium 400mg  with 500iu of Vitamin D         .  cholecalciferol (VITAMIN D) 1000 UNITS tablet  Take 1,000 Units by mouth daily.         .  Clindamycin Phosphate POWD  Apply topically as needed (lotion, as needed for rosacea).         .  Coenzyme Q10 (CO Q10) 100 MG CAPS  Take by mouth.         Marland Kitchen  ibuprofen (ADVIL,MOTRIN) 200 MG tablet  Take 200 mg by mouth  every 6 (six) hours as needed for pain.         Boris Lown Oil 300 MG CAPS  Take 353 mg by mouth.         Marland Kitchen  lisinopril (PRINIVIL,ZESTRIL) 5 MG tablet  Take by mouth daily with supper.         .  loratadine (CLARITIN) 10 MG tablet  Take 10 mg by mouth daily. Alavert-Antihistamine         .  Magnesium 250 MG TABS  Take by mouth daily.         .  ranitidine (ZANTAC) 75 MG tablet  Take 75 mg by mouth as needed for heartburn.             No current facility-administered medications for this visit.        Review of Systems Review of Systems  Constitutional: Positive for unexpected weight change. Negative for fever and chills.  HENT: Negative for hearing loss, congestion, sore throat, trouble swallowing and voice change.         Difficulty swallowing. Esophageal stenosis.  Eyes: Negative for visual disturbance.  Respiratory: Negative for cough and wheezing.   Cardiovascular: Negative for chest pain, palpitations and leg swelling.  Gastrointestinal: Negative for nausea, vomiting, abdominal pain, diarrhea, constipation, blood in stool, abdominal distention and anal bleeding.  Genitourinary: Negative for hematuria, vaginal bleeding and difficulty urinating.  Musculoskeletal: Positive for myalgias and arthralgias.  Skin: Negative for rash and wound.  Neurological: Negative for seizures, syncope and headaches.  Hematological: Negative for adenopathy. Does not bruise/bleed easily.  Psychiatric/Behavioral: Negative for confusion. The patient is nervous/anxious.          Physical Exam  Constitutional: She is oriented to person, place, and  time. She appears well-developed and well-nourished. No distress.  HENT:   Head: Normocephalic and atraumatic.   Nose: Nose normal.   Mouth/Throat: No oropharyngeal exudate.  Eyes: Conjunctivae and EOM are normal. Pupils are equal, round, and reactive to light. Left eye exhibits no discharge. No scleral icterus.  Neck: Neck supple. No JVD present. No  tracheal deviation present. No thyromegaly present.  Cardiovascular: Normal rate, regular rhythm, normal heart sounds and intact distal pulses.    No murmur heard. Pulmonary/Chest: Effort normal and breath sounds normal. No respiratory distress. She has no wheezes. She has no rales. She exhibits no tenderness.    Transverse scar right breast, 12:00 position. A 4 cm mass right breast, upper outer quadrant coming close to the areolar margin but not involving skin. Mobile. No other masses in either breast. No axillary adenopathy.  Abdominal: Soft. Bowel sounds are normal. She exhibits no distension and no mass. There is no tenderness. There is no rebound and no guarding.    Right subcostal scar and lower midline scar well healed.  Musculoskeletal: She exhibits no edema and no tenderness.  Lymphadenopathy:    She has no cervical adenopathy.  Neurological: She is alert and oriented to person, place, and time. She exhibits normal muscle tone. Coordination normal.  Skin: Skin is warm. No rash noted. She is not diaphoretic. No erythema. No pallor.  Psychiatric: She has a normal mood and affect. Her behavior is normal. Judgment and thought content normal.      Data Reviewed Imaging studies and histology slides. I have reviewed her case in conferences morning. Contact coordinate her care plan with Dr. Drue Second and Dr. Dorothy Puffer.   Assessment    Invasive ductal carcinoma right breast, upper outer quadrant, receptor positive, HER-2-negative, extending back to the pectoralis fascia.   Positive family history of breast cancer and daughter, and, and several cousins.   GERD and esophageal stricture   Hypertension   Chronic anxiety   Prediabetes   History of open appendectomy   History open cholecystectomy      Plan    We discussed the options of neoadjuvant antiestrogen therapy to downstage his tumor followed by lumpectomy and sentinel node biopsy. We discussed the option  of right total mastectomy and sentinel node biopsy. She strongly prefers proceeding with definitive surgery which will require a mastectomy at this time due to the size of the tumor   She will be scheduled for right total mastectomy with sentinel lymph node biopsy. She may or may not need postop radiation therapy.   She will be offered antiestrogen therapy postop.   She'll be referred for genetic counseling and testing at a later date   I discussed the indications, details, techniques, and numerous risks of her surgery. She's aware of  the risk of bleeding, infection, skin necrosis, arm swelling, arm numbness, frozen shoulder, and other unforseen problems. She understands these issues well and she has had all of her questions are answered. She is in full agreement with this plan.           Angelia Mould. Derrell Lolling, M.D., [facility] General and Minimally invasive Surgery Breast and Colorectal Surgery Office:   [phone] Pager:   [phone]

## 2012-10-31 ENCOUNTER — Observation Stay (HOSPITAL_COMMUNITY)
Admission: RE | Admit: 2012-10-31 | Discharge: 2012-11-01 | Disposition: A | Discharge status: Home / Self Care | Payer: Medicare Other | Source: Ambulatory Visit | Attending: General Surgery | Admitting: General Surgery

## 2012-10-31 ENCOUNTER — Encounter (HOSPITAL_COMMUNITY)
Admission: RE | Admit: 2012-10-31 | Discharge: 2012-10-31 | Disposition: A | Discharge status: Home / Self Care | Payer: Medicare Other | Source: Ambulatory Visit | Attending: General Surgery | Admitting: General Surgery

## 2012-10-31 ENCOUNTER — Ambulatory Visit (HOSPITAL_COMMUNITY): Payer: Medicare Other

## 2012-10-31 ENCOUNTER — Encounter (HOSPITAL_COMMUNITY): Payer: Self-pay | Admitting: Vascular Surgery

## 2012-10-31 ENCOUNTER — Ambulatory Visit (HOSPITAL_COMMUNITY): Payer: Medicare Other | Admitting: Anesthesiology

## 2012-10-31 ENCOUNTER — Encounter (HOSPITAL_COMMUNITY): Payer: Self-pay | Admitting: *Deleted

## 2012-10-31 ENCOUNTER — Encounter (HOSPITAL_COMMUNITY)
Admission: RE | Disposition: A | Discharge status: Home / Self Care | Payer: Self-pay | Source: Ambulatory Visit | Attending: General Surgery

## 2012-10-31 DIAGNOSIS — C50411 Malignant neoplasm of upper-outer quadrant of right female breast: Secondary | ICD-10-CM

## 2012-10-31 DIAGNOSIS — E119 Type 2 diabetes mellitus without complications: Secondary | ICD-10-CM | POA: Insufficient documentation

## 2012-10-31 DIAGNOSIS — D0590 Unspecified type of carcinoma in situ of unspecified breast: Principal | ICD-10-CM | POA: Insufficient documentation

## 2012-10-31 DIAGNOSIS — I10 Essential (primary) hypertension: Secondary | ICD-10-CM | POA: Insufficient documentation

## 2012-10-31 DIAGNOSIS — C50919 Malignant neoplasm of unspecified site of unspecified female breast: Secondary | ICD-10-CM | POA: Insufficient documentation

## 2012-10-31 HISTORY — PX: MASTECTOMY: SHX3

## 2012-10-31 HISTORY — PX: MASTECTOMY W/ SENTINEL NODE BIOPSY: SHX2001

## 2012-10-31 LAB — GLUCOSE, CAPILLARY: Glucose-Capillary: 179 mg/dL — ABNORMAL HIGH (ref 70–99)

## 2012-10-31 SURGERY — MASTECTOMY WITH SENTINEL LYMPH NODE BIOPSY
Anesthesia: General | Site: Breast | Laterality: Right | Wound class: Clean

## 2012-10-31 MED ORDER — OXYCODONE HCL 5 MG/5ML PO SOLN: 5.0000 mg | Freq: Once | ORAL | Status: DC | PRN

## 2012-10-31 MED ORDER — LIDOCAINE HCL (CARDIAC) 20 MG/ML IV SOLN
INTRAVENOUS | Status: DC | PRN
  Administered 2012-10-31: 60 mg via INTRAVENOUS

## 2012-10-31 MED ORDER — ONDANSETRON HCL 4 MG/2ML IJ SOLN
INTRAMUSCULAR | Status: DC | PRN
  Administered 2012-10-31: 4 mg via INTRAVENOUS

## 2012-10-31 MED ORDER — 0.9 % SODIUM CHLORIDE (POUR BTL) OPTIME
TOPICAL | Status: DC | PRN
  Administered 2012-10-31 (×2): 1000 mL

## 2012-10-31 MED ORDER — HYDROCODONE-ACETAMINOPHEN 5-325 MG PO TABS
1.0000 | ORAL_TABLET | ORAL | Status: DC | PRN
  Administered 2012-11-01: 2 via ORAL
  Administered 2012-11-01: 1 via ORAL
  Filled 2012-10-31: qty 1
  Filled 2012-10-31: qty 2

## 2012-10-31 MED ORDER — ARTIFICIAL TEARS OP OINT
TOPICAL_OINTMENT | OPHTHALMIC | Status: DC | PRN
  Administered 2012-10-31: 1 via OPHTHALMIC

## 2012-10-31 MED ORDER — MAGNESIUM 250 MG PO TABS: 250.0000 mg | ORAL_TABLET | Freq: Every day | ORAL | Status: DC

## 2012-10-31 MED ORDER — WHITE PETROLATUM GEL
Status: AC
  Administered 2012-10-31: 13:00:00
  Filled 2012-10-31: qty 5

## 2012-10-31 MED ORDER — ALPRAZOLAM 0.25 MG PO TABS
0.2500 mg | ORAL_TABLET | Freq: Every day | ORAL | Status: DC
  Administered 2012-10-31: 0.25 mg via ORAL
  Filled 2012-10-31: qty 1

## 2012-10-31 MED ORDER — CALCIUM CARB-CHOLECALCIFEROL 500-400 MG-UNIT PO TABS: 1.0000 | ORAL_TABLET | Freq: Two times a day (BID) | ORAL | Status: DC

## 2012-10-31 MED ORDER — HYDROMORPHONE HCL PF 1 MG/ML IJ SOLN
INTRAMUSCULAR | Status: AC
  Filled 2012-10-31: qty 1

## 2012-10-31 MED ORDER — PROPOFOL 10 MG/ML IV BOLUS
INTRAVENOUS | Status: DC | PRN
  Administered 2012-10-31: 130 mg via INTRAVENOUS

## 2012-10-31 MED ORDER — PROMETHAZINE HCL 25 MG/ML IJ SOLN: 6.2500 mg | INTRAMUSCULAR | Status: DC | PRN

## 2012-10-31 MED ORDER — PANTOPRAZOLE SODIUM 40 MG PO TBEC
40.0000 mg | DELAYED_RELEASE_TABLET | Freq: Every day | ORAL | Status: DC
  Administered 2012-11-01: 40 mg via ORAL
  Filled 2012-10-31: qty 1

## 2012-10-31 MED ORDER — LISINOPRIL 2.5 MG PO TABS
2.5000 mg | ORAL_TABLET | Freq: Every day | ORAL | Status: DC
  Administered 2012-10-31: 2.5 mg via ORAL
  Filled 2012-10-31 (×2): qty 1

## 2012-10-31 MED ORDER — TECHNETIUM TC 99M SULFUR COLLOID FILTERED
1.0000 | Freq: Once | INTRAVENOUS | Status: AC | PRN
  Administered 2012-10-31: 1 via INTRADERMAL

## 2012-10-31 MED ORDER — OXYCODONE HCL 5 MG PO TABS: 5.0000 mg | ORAL_TABLET | Freq: Once | ORAL | Status: DC | PRN

## 2012-10-31 MED ORDER — ONDANSETRON HCL 4 MG PO TABS: 4.0000 mg | ORAL_TABLET | Freq: Four times a day (QID) | ORAL | Status: DC | PRN

## 2012-10-31 MED ORDER — INSULIN ASPART 100 UNIT/ML ~~LOC~~ SOLN
0.0000 [IU] | Freq: Three times a day (TID) | SUBCUTANEOUS | Status: DC
  Administered 2012-10-31: 3 [IU] via SUBCUTANEOUS

## 2012-10-31 MED ORDER — CALCIUM CARBONATE-VITAMIN D 500-200 MG-UNIT PO TABS
1.0000 | ORAL_TABLET | Freq: Two times a day (BID) | ORAL | Status: DC
  Filled 2012-10-31 (×3): qty 1

## 2012-10-31 MED ORDER — ONDANSETRON HCL 4 MG/2ML IJ SOLN: 4.0000 mg | Freq: Four times a day (QID) | INTRAMUSCULAR | Status: DC | PRN

## 2012-10-31 MED ORDER — METHYLENE BLUE 1 % INJ SOLN
INTRAMUSCULAR | Status: AC
  Filled 2012-10-31: qty 10

## 2012-10-31 MED ORDER — MORPHINE SULFATE 2 MG/ML IJ SOLN
1.0000 mg | INTRAMUSCULAR | Status: DC | PRN
  Administered 2012-10-31 (×2): 2 mg via INTRAVENOUS
  Filled 2012-10-31 (×2): qty 1

## 2012-10-31 MED ORDER — FENTANYL CITRATE 0.05 MG/ML IJ SOLN
INTRAMUSCULAR | Status: DC | PRN
  Administered 2012-10-31 (×4): 50 ug via INTRAVENOUS

## 2012-10-31 MED ORDER — LACTATED RINGERS IV SOLN
INTRAVENOUS | Status: DC
  Administered 2012-10-31 (×2): via INTRAVENOUS

## 2012-10-31 MED ORDER — ENOXAPARIN SODIUM 30 MG/0.3ML ~~LOC~~ SOLN
30.0000 mg | SUBCUTANEOUS | Status: DC
  Filled 2012-10-31: qty 0.3

## 2012-10-31 MED ORDER — EPHEDRINE SULFATE 50 MG/ML IJ SOLN
INTRAMUSCULAR | Status: DC | PRN
  Administered 2012-10-31: 5 mg via INTRAVENOUS
  Administered 2012-10-31: 10 mg via INTRAVENOUS
  Administered 2012-10-31: 5 mg via INTRAVENOUS
  Administered 2012-10-31: 10 mg via INTRAVENOUS

## 2012-10-31 MED ORDER — FENTANYL CITRATE 0.05 MG/ML IJ SOLN
100.0000 ug | INTRAMUSCULAR | Status: DC | PRN
  Administered 2012-10-31: 100 ug via INTRAVENOUS

## 2012-10-31 MED ORDER — BIOTIN 1000 MCG PO TABS: 1000.0000 ug | ORAL_TABLET | Freq: Every day | ORAL | Status: DC

## 2012-10-31 MED ORDER — LORATADINE 10 MG PO TABS
10.0000 mg | ORAL_TABLET | Freq: Every day | ORAL | Status: DC
  Filled 2012-10-31: qty 1

## 2012-10-31 MED ORDER — INSULIN ASPART 100 UNIT/ML ~~LOC~~ SOLN: 0.0000 [IU] | Freq: Every day | SUBCUTANEOUS | Status: DC

## 2012-10-31 MED ORDER — DEXAMETHASONE SODIUM PHOSPHATE 4 MG/ML IJ SOLN
INTRAMUSCULAR | Status: DC | PRN
  Administered 2012-10-31: 8 mg via INTRAVENOUS

## 2012-10-31 MED ORDER — LACTATED RINGERS IV SOLN: INTRAVENOUS | Status: DC | PRN

## 2012-10-31 MED ORDER — FENTANYL CITRATE 0.05 MG/ML IJ SOLN
INTRAMUSCULAR | Status: AC
  Administered 2012-10-31: 100 ug via INTRAVENOUS
  Filled 2012-10-31: qty 2

## 2012-10-31 MED ORDER — MAGNESIUM OXIDE 400 (241.3 MG) MG PO TABS
400.0000 mg | ORAL_TABLET | Freq: Every day | ORAL | Status: DC
  Filled 2012-10-31 (×2): qty 1

## 2012-10-31 MED ORDER — ACETAMINOPHEN 325 MG PO TABS: 325.0000 mg | ORAL_TABLET | Freq: Every day | ORAL | Status: DC | PRN

## 2012-10-31 MED ORDER — SODIUM CHLORIDE 0.9 % IJ SOLN
INTRAMUSCULAR | Status: DC | PRN
  Administered 2012-10-31: 09:00:00 via INTRAMUSCULAR

## 2012-10-31 MED ORDER — HYDROMORPHONE HCL PF 1 MG/ML IJ SOLN
0.2500 mg | INTRAMUSCULAR | Status: DC | PRN
  Administered 2012-10-31 (×4): 0.25 mg via INTRAVENOUS

## 2012-10-31 MED ORDER — POTASSIUM CHLORIDE IN NACL 20-0.9 MEQ/L-% IV SOLN
INTRAVENOUS | Status: DC
  Administered 2012-10-31: 14:00:00 via INTRAVENOUS
  Filled 2012-10-31 (×4): qty 1000

## 2012-10-31 SURGICAL SUPPLY — 57 items
ADH SKN CLS APL DERMABOND .7 (GAUZE/BANDAGES/DRESSINGS) ×1
APPLIER CLIP 9.375 MED OPEN (MISCELLANEOUS) ×2
APR CLP MED 9.3 20 MLT OPN (MISCELLANEOUS) ×1
BINDER BREAST LRG (GAUZE/BANDAGES/DRESSINGS) ×1 IMPLANT
BINDER BREAST MEDIUM (GAUZE/BANDAGES/DRESSINGS) ×1 IMPLANT
BINDER BREAST XLRG (GAUZE/BANDAGES/DRESSINGS) IMPLANT
BIOPATCH RED 1 DISK 7.0 (GAUZE/BANDAGES/DRESSINGS) ×2 IMPLANT
CANISTER SUCTION 2500CC (MISCELLANEOUS) ×2 IMPLANT
CHLORAPREP W/TINT 26ML (MISCELLANEOUS) ×2 IMPLANT
CLIP APPLIE 9.375 MED OPEN (MISCELLANEOUS) IMPLANT
CLOTH BEACON ORANGE TIMEOUT ST (SAFETY) ×2 IMPLANT
CONT SPEC 4OZ CLIKSEAL STRL BL (MISCELLANEOUS) ×2 IMPLANT
COVER PROBE W GEL 5X96 (DRAPES) ×2 IMPLANT
COVER SURGICAL LIGHT HANDLE (MISCELLANEOUS) ×2 IMPLANT
DERMABOND ADVANCED (GAUZE/BANDAGES/DRESSINGS) ×1
DERMABOND ADVANCED .7 DNX12 (GAUZE/BANDAGES/DRESSINGS) ×1 IMPLANT
DRAIN CHANNEL 19F RND (DRAIN) ×3 IMPLANT
DRAPE LAPAROSCOPIC ABDOMINAL (DRAPES) ×2 IMPLANT
DRAPE UTILITY 15X26 W/TAPE STR (DRAPE) ×4 IMPLANT
DRSG PAD ABDOMINAL 8X10 ST (GAUZE/BANDAGES/DRESSINGS) ×2 IMPLANT
ELECT BLADE 4.0 EZ CLEAN MEGAD (MISCELLANEOUS) ×2
ELECT CAUTERY BLADE 6.4 (BLADE) ×2 IMPLANT
ELECT REM PT RETURN 9FT ADLT (ELECTROSURGICAL) ×2
ELECTRODE BLDE 4.0 EZ CLN MEGD (MISCELLANEOUS) ×1 IMPLANT
ELECTRODE REM PT RTRN 9FT ADLT (ELECTROSURGICAL) ×1 IMPLANT
EVACUATOR SILICONE 100CC (DRAIN) ×3 IMPLANT
GLOVE BIO SURGEON STRL SZ7.5 (GLOVE) ×1 IMPLANT
GLOVE BIO SURGEON STRL SZ8 (GLOVE) ×1 IMPLANT
GLOVE BIOGEL PI IND STRL 7.5 (GLOVE) IMPLANT
GLOVE BIOGEL PI IND STRL 8 (GLOVE) IMPLANT
GLOVE BIOGEL PI INDICATOR 7.5 (GLOVE) ×1
GLOVE BIOGEL PI INDICATOR 8 (GLOVE) ×1
GLOVE EUDERMIC 7 POWDERFREE (GLOVE) ×2 IMPLANT
GOWN STRL NON-REIN LRG LVL3 (GOWN DISPOSABLE) ×3 IMPLANT
GOWN STRL REIN XL XLG (GOWN DISPOSABLE) ×3 IMPLANT
KIT BASIN OR (CUSTOM PROCEDURE TRAY) ×2 IMPLANT
KIT ROOM TURNOVER OR (KITS) ×2 IMPLANT
MARKER SKIN DUAL TIP RULER LAB (MISCELLANEOUS) ×2 IMPLANT
NDL 18GX1X1/2 (RX/OR ONLY) (NEEDLE) ×1 IMPLANT
NDL HYPO 25GX1X1/2 BEV (NEEDLE) ×2 IMPLANT
NEEDLE 18GX1X1/2 (RX/OR ONLY) (NEEDLE) ×2 IMPLANT
NEEDLE HYPO 25GX1X1/2 BEV (NEEDLE) ×2 IMPLANT
NS IRRIG 1000ML POUR BTL (IV SOLUTION) ×2 IMPLANT
PACK GENERAL/GYN (CUSTOM PROCEDURE TRAY) ×2 IMPLANT
PAD ARMBOARD 7.5X6 YLW CONV (MISCELLANEOUS) ×4 IMPLANT
SPECIMEN JAR LARGE (MISCELLANEOUS) ×2 IMPLANT
SPONGE GAUZE 4X4 12PLY (GAUZE/BANDAGES/DRESSINGS) ×1 IMPLANT
STAPLER VISISTAT 35W (STAPLE) ×1 IMPLANT
SUT ETHILON 3 0 FSL (SUTURE) ×3 IMPLANT
SUT MNCRL AB 4-0 PS2 18 (SUTURE) ×2 IMPLANT
SUT SILK 2 0 FS (SUTURE) ×2 IMPLANT
SUT VIC AB 3-0 SH 18 (SUTURE) ×1 IMPLANT
SUT VIC AB 3-0 SH 8-18 (SUTURE) ×2 IMPLANT
SYR CONTROL 10ML LL (SYRINGE) ×4 IMPLANT
TOWEL OR 17X24 6PK STRL BLUE (TOWEL DISPOSABLE) ×2 IMPLANT
TOWEL OR 17X26 10 PK STRL BLUE (TOWEL DISPOSABLE) ×2 IMPLANT
WATER STERILE IRR 1000ML POUR (IV SOLUTION) IMPLANT

## 2012-10-31 NOTE — Anesthesia Postprocedure Evaluation (Signed)
Anesthesia Post Note  Patient: Melody Tran  Procedure(s) Performed: Procedure(s) (LRB): Right total MASTECTOMY WITH right axillary SENTINEL LYMPH NODE BIOPSY (Right)  Anesthesia type: general  Patient location: PACU  Post pain: Pain level controlled  Post assessment: Patient's Cardiovascular Status Stable  Post vital signs: Reviewed and stable  Level of consciousness: sedated  Complications: No apparent anesthesia complications

## 2012-10-31 NOTE — Progress Notes (Signed)
Report rec'd from [facility], relieving for lunch relief

## 2012-10-31 NOTE — Transfer of Care (Signed)
Immediate Anesthesia Transfer of Care Note  Patient: Melody Tran  Procedure(s) Performed: Procedure(s): Right total MASTECTOMY WITH right axillary SENTINEL LYMPH NODE BIOPSY (Right)  Patient Location: PACU  Anesthesia Type:General  Level of Consciousness: awake, alert  and oriented  Airway & Oxygen Therapy: Patient Spontanous Breathing and Patient connected to nasal cannula oxygen  Post-op Assessment: Report given to PACU RN  Post vital signs: Reviewed and stable  Complications: No apparent anesthesia complications

## 2012-10-31 NOTE — Anesthesia Procedure Notes (Signed)
Procedure Name: LMA Insertion Date/Time: 10/31/2012 8:57 AM Performed by: Jefm Miles E Pre-anesthesia Checklist: Patient identified, Timeout performed, Emergency Drugs available, Suction available and Patient being monitored Patient Re-evaluated:Patient Re-evaluated prior to inductionOxygen Delivery Method: Circle system utilized Preoxygenation: Pre-oxygenation with 100% oxygen Intubation Type: IV induction Ventilation: Mask ventilation without difficulty LMA: LMA inserted LMA Size: 4.0 Number of attempts: 1 Placement Confirmation: positive ETCO2 and breath sounds checked- equal and bilateral Tube secured with: Tape Dental Injury: Teeth and Oropharynx as per pre-operative assessment

## 2012-10-31 NOTE — Op Note (Signed)
Patient Name:           Melody Tran   Date of Surgery:        10/31/2012  Pre op Diagnosis:      Invasive ductal carcinoma right breast, 3:00 position, 3.3 cm size, receptor positive, HER-2/neu negative, clinical stage T2, N0  Post op Diagnosis:    same  Procedure:                 Inject blue dye right breast Right total mastectomy Right axillary sentinel node biopsy  Surgeon:                     Angelia Mould. Derrell Lolling, M.D., FACS  Assistant:                      Magnus Ivan, RNFA  Operative Indications:   Melody Tran is a 79 y.o. female. She is referred by Dr. Roswell Nickel at the [facility] For managemengt of a newly diagnosed cancer of the lateral aspect of the right breast. Her primary care physician is Dr. Maurice Small. She was evaluated in the [facility] recently  by Dr. Welton Flakes, Dr. Dorothy Puffer, and me.  She felt a lump in her right breast about 1 month ago.   Her last mammogram was 2 years ago. Mammogram and right breast ultrasound were performed on 09/07/2012. This revealed a 3.3 x 2.2 x 1.6 cm mass with calcifications in the upper outer quadrant of the right breast. The ultrasound this was poorly defined and stated to be at 9:30, 8 cm from the nipple and to be 2.2 cm. The right axilla looked normal by ultrasound. Image guided biopsy showed a receptor positive, HER-2-negative invasive ductal carcinoma. Subsequent MRI showed a solitary finding in the right breast being 3.3 cm in size extending all the way back to the pectoralis fascia, but did not appear to be invading the muscle. The left breast looked normal. There was no adenopathy.  We  discussed her care in breast conference  and again in the clinic. The 3 of [place] concurred that she is not a good candidate for lumpectomy at this time due to the size of the tumor, But that we could consider neoadjuvant antiestrogen therapy to downsize the tumor.The patient is not interested in neoadjuvant antiestrogen therapy and requested we proceed with definitive  surgery as the next step in the near future.  Past history is significant for open appendectomy, open cholecystectomy, right breast biopsy for benign disease 1979, hypertension, anxiety, GERD, diabetes.  Family history reveals her daughter was diagnosed with breast cancer at age 63. 1 aunt and 4 cousins have breast cancer. No one has had genetic testing   Operative Findings:       There was a palpable mass in the right breast laterally. This was extending down to the posterior aspect of the breast but clearly had not invaded the muscle and resection off of the pectoralis major and minor muscles was clean. We took the pectoralis fascia with the specimen. 2 sentinel lymph nodes were identified. Frozen section evaluation by Dr. Colonel Bald was negative for  cancerous cells.  Procedure in Detail:          The patient underwent injection of radionuclide into the right breast by the nuclear medicine technician in the preop area. The patient was taken to the operating room where general anesthesia was induced. Surgical time out was performed and intravenous antibiotics were given. Following alcohol prep  I injected 5 cc of blue dye into the right breast, subareolar area. This was 2 cc of methylene blue mixed with 3 cc of saline. The breast was massaged for a few minutes. We then prepped and draped the entire right chest wall and axilla. Using a marking pen I drew a transverse elliptical incision to encompass the nipple and areola and to get widely around the laterally placed mass. The incision was made and I raised skin flaps superiorly to the infraclavicular area, medially to the parasternal area, inferiorly to the anterior rectus sheath and laterally to the latissimus dorsi muscle. Using the neoprobe I dissected up into the axilla and found two very hot, very blue lymph nodes. These were sent to the lab and Dr. Colonel Bald examined thim and called and stated they were negative for cancer cells. The breast was then dissected off  of the pectoralis major and minor muscles. The lateral skin margin was marked with a silk suture. The breast specimen was sent for routine histology. Hemostasis was excellent and achieved with electrocautery and a few metal clips. Hemostasis was excellent. Two-19 [place] Blake drains were placed, one up into the axilla and one across the skin flaps. These were brought  inferior laterally through separate stab incisions and connected to suction bulbs. The mastectomy incision was closed in 2 layers. Subcutaneous tissue with interrupted 3-0 Vicryl sutures and the skin with a running subcuticular suture of 4-0 Monocryl and Dermabond. Breast binder was placed and the patient taken to recovery in stable condition. EBL 50 cc or less. Counts correct. Complications none.     Angelia Mould. Derrell Lolling, M.D., FACS General and Minimally Invasive Surgery Breast and Colorectal Surgery  10/31/2012 10:36 AM

## 2012-10-31 NOTE — Anesthesia Preprocedure Evaluation (Addendum)
Anesthesia Evaluation  Patient identified by MRN, date of birth, ID band Patient awake    Reviewed: Allergy & Precautions, H&P , NPO status , Patient's Chart, lab work & pertinent test results  History of Anesthesia Complications Negative for: history of anesthetic complications  Airway Mallampati: I TM Distance: >3 FB     Dental  (+) Teeth Intact and Dental Advisory Given   Pulmonary neg pulmonary ROS,          Cardiovascular hypertension, Pt. on medications Rhythm:Regular Rate:Normal     Neuro/Psych negative neurological ROS     GI/Hepatic Neg liver ROS, GERD-  Medicated,  Endo/Other  diabetes  Renal/GU negative Renal ROS     Musculoskeletal   Abdominal   Peds  Hematology   Anesthesia Other Findings   Reproductive/Obstetrics                         Anesthesia Physical Anesthesia Plan  ASA: III  Anesthesia Plan: General   Post-op Pain Management:    Induction: Intravenous  Airway Management Planned: LMA  Additional Equipment:   Intra-op Plan:   Post-operative Plan: Extubation in OR  Informed Consent:   Plan Discussed with: CRNA, Anesthesiologist and Surgeon  Anesthesia Plan Comments:         Anesthesia Quick Evaluation

## 2012-10-31 NOTE — Preoperative (Signed)
Beta Blockers   Reason not to administer Beta Blockers:Not Applicable 

## 2012-10-31 NOTE — Interval H&P Note (Signed)
History and Physical Interval Note:  10/31/2012 8:35 AM  Melody Tran  has presented today for surgery, with the diagnosis of invasive cancer right breast  The goals and the various methods of treatment have been discussed with the patient and family. After consideration of risks, benefits and other options for treatment, the patient has consented to  Procedure(s): Right total MASTECTOMY WITH right axillary SENTINEL LYMPH NODE BIOPSY (Right) as a surgical intervention .  The patient's history has been reviewed, patient examined today , no change in status, stable for surgery.  I have reviewed the patient's chart and labs.  Questions were answered to the patient's satisfaction.     Ernestene Mention

## 2012-11-01 ENCOUNTER — Encounter (HOSPITAL_COMMUNITY): Payer: Self-pay | Admitting: General Surgery

## 2012-11-01 MED ORDER — HYDROCODONE-ACETAMINOPHEN 5-325 MG PO TABS: 1.0000 | ORAL_TABLET | ORAL | Status: DC | PRN

## 2012-11-01 NOTE — Discharge Summary (Signed)
Patient ID: Melody Tran [id number] 79 y.o. 21-Dec-1928  Admit date: 10/31/2012  Discharge date and time: 11/01/2012  Admitting Physician: Ernestene Mention  Discharge Physician: Ernestene Mention  Admission Diagnoses: invasive cancer right breast  Discharge Diagnoses: Invasive ductal carcinoma right breast, 3:00 position, 3.3 cm size, receptor positive, HER-2/neu negative, clinical stage T2, N0 Hypertension Diabetes, diet controlled   Operations: Procedure(s): Right total MASTECTOMY WITH right axillary SENTINEL LYMPH NODE BIOPSY  Admission Condition: good  Discharged Condition: good  Indication for Admission:   Melody Tran is a 79 y.o. female. She is referred by Dr. Roswell Nickel at the [facility] For managemengt of a newly diagnosed cancer of the lateral aspect of the right breast. Her primary care physician is Dr. Maurice Small. She was evaluated in the [facility] recently by Dr. Welton Flakes, Dr. Dorothy Puffer, and me.  She felt a lump in her right breast about 1 month ago. Her last mammogram was 2 years ago. Mammogram and right breast ultrasound were performed on 09/07/2012. This revealed a 3.3 x 2.2 x 1.6 cm mass with calcifications in the upper outer quadrant of the right breast. The ultrasound this was poorly defined and stated to be at 9:30, 8 cm from the nipple and to be 2.2 cm. The right axilla looked normal by ultrasound. Image guided biopsy showed a receptor positive, HER-2-negative invasive ductal carcinoma. Subsequent MRI showed a solitary finding in the right breast being 3.3 cm in size extending all the way back to the pectoralis fascia, but did not appear to be invading the muscle. The left breast looked normal. There was no adenopathy.  We discussed her care in breast conference and again in the clinic. The 3 of [place] concurred that she is not a good candidate for lumpectomy at this time due to the size of the tumor, But that we could consider neoadjuvant antiestrogen therapy to downsize the  tumor.The patient is not interested in neoadjuvant antiestrogen therapy and requested we proceed with definitive surgery as the next step in the near future.  Past history is significant for open appendectomy, open cholecystectomy, right breast biopsy for benign disease 1979, hypertension, anxiety, GERD, diabetes.  Family history reveals her daughter was diagnosed with breast cancer at age 21. 1 aunt and 4 cousins have breast cancer. No one has had genetic testing   Hospital Course: on the day of admission the patient was taken to the operating room and underwent a right total mastectomy and right axillary sentinel lymph biopsy. Intraoperative assessment of the sentinel nodes by pathology was negative for cancer cells. The surgery was uneventful. The patient remained stable. On postop day one she was asking to go home he she had tolerated diet, had minimal pain, was ambulatory and voiding uneventfully. Examination of the wounds revealed the skin flaps were pink and viable and healthy. Both drains were functioning and draining serosanguineous fluid. There was no hematoma. She was given instructions in diet and activities and wound care. She was given a prescription for hydrocodone for pain. She was told that the pathology report will be called to her later this week. She was advised to see me in my office in one week for wound and drain tract.  Consults: None  Significant Diagnostic Studies: surgical pathology, pending  Treatments: surgery: right total mastectomy, right axillary sentinel node biopsy  Disposition: Home  Patient Instructions:    Medication List         acetaminophen 325 MG tablet  Commonly known as:  TYLENOL  Take 325 mg by mouth daily as needed for pain.     ALPRAZolam 0.5 MG tablet  Commonly known as:  XANAX  Take 0.25 mg by mouth at bedtime.     Biotin 1000 MCG tablet  Take 1,000 mcg by mouth daily.     CALCIUM 500 +D 500-400 MG-UNIT Tabs  Generic drug:  Calcium  Carb-Cholecalciferol  Take 1 tablet by mouth 2 (two) times daily.     cholecalciferol 1000 UNITS tablet  Commonly known as:  VITAMIN D  Take 1,000 Units by mouth daily.     esomeprazole 20 MG capsule  Commonly known as:  NEXIUM  Take 20 mg by mouth daily before breakfast.     HYDROcodone-acetaminophen 5-325 MG per tablet  Commonly known as:  NORCO/VICODIN  Take 1-2 tablets by mouth every 4 (four) hours as needed.     KRILL OIL PO  Take 353 mg by mouth daily.     lisinopril 5 MG tablet  Commonly known as:  PRINIVIL,ZESTRIL  Take 2.5 mg by mouth daily with supper.     loratadine 10 MG tablet  Commonly known as:  CLARITIN  Take 10 mg by mouth daily. Alavert-Antihistamine     Magnesium 250 MG Tabs  Take 250 mg by mouth daily.     PRESCRIPTION MEDICATION  Apply 1 application topically 4 (four) times daily. Diltiazem cream 2% compounded at [facility] (to reduce spasm of anal sphincter)     TEARS NATURALE OP  Place 1 drop into both eyes 4 (four) times daily. For Rosacea of eyes        Activity: activity as tolerated Diet: low fat, low cholesterol diet Wound Care: as directed  Follow-up:  With Dr. Derrell Lolling in 1 week.  Signed: Angelia Mould. Derrell Lolling, M.D., FACS General and minimally invasive surgery Breast and Colorectal Surgery  11/01/2012, 5:47 AM

## 2012-11-01 NOTE — Progress Notes (Signed)
Pt discharged to home

## 2012-11-03 ENCOUNTER — Telehealth (INDEPENDENT_AMBULATORY_CARE_PROVIDER_SITE_OTHER): Payer: Self-pay | Admitting: General Surgery

## 2012-11-03 NOTE — Telephone Encounter (Signed)
Pathology report shows negative nodes and negative margins, 3.5 cm tumor. I called [name]. I discussed the pathology report with her. She says she is doing fine postop. I will see her in the office next week.   Melody Tran. Derrell Lolling, M.D., [facility] General and Minimally invasive Surgery Breast and Colorectal Surgery Office:   [phone] Pager:   [phone]

## 2012-11-10 ENCOUNTER — Encounter (INDEPENDENT_AMBULATORY_CARE_PROVIDER_SITE_OTHER): Payer: Self-pay | Admitting: General Surgery

## 2012-11-10 ENCOUNTER — Ambulatory Visit (INDEPENDENT_AMBULATORY_CARE_PROVIDER_SITE_OTHER): Payer: PRIVATE HEALTH INSURANCE | Admitting: General Surgery

## 2012-11-10 VITALS — BP 128/70 | HR 96 | Temp 98.4°F | Resp 14 | Ht 65.0 in | Wt 108.2 lb

## 2012-11-10 DIAGNOSIS — C50419 Malignant neoplasm of upper-outer quadrant of unspecified female breast: Secondary | ICD-10-CM

## 2012-11-10 DIAGNOSIS — C50411 Malignant neoplasm of upper-outer quadrant of right female breast: Secondary | ICD-10-CM

## 2012-11-10 NOTE — Addendum Note (Signed)
Addended byIvory Broad on: 11/10/2012 04:04 PM   Modules accepted: Orders

## 2012-11-10 NOTE — Progress Notes (Signed)
Patient ID: Melody Tran, female   DOB: 30-Mar-1928, 78 y.o.   MRN: [id number] History: This patient underwent right mastectomy and sentinel node biopsy on 10/31/2012. Final pathology shows invasive ductal carcinoma and associated DCIS, 3.5 cm diameter, receptor positive, HER-2-negative, stage T2, N0. Stage II A. She is doing well. Minimal pain. Sleeping reasonably well at night. The drainage is about 5 cc a day from each of the 2 drains.  Exam: Patient is alert. Distress. Family member with her Right mastectomy skin flaps healing nicely. No necrosis. No infection. They appear well stuck down. Both drains were removed.  Assessment:  Invasive ductal carcinoma right breast, 3:00 position, 3.5 cm in diameter, receptor positive, HER-2-negative. Pathologic stage T2, N0. Stage IIA. Recovery uneventfully and early postop period  Plan:  Wound care discussed. Referred to physical therapy She will see Dr. Welton Flakes next week Return to see me one month.    Angelia Mould. Derrell Lolling, M.D., [facility] General and Minimally invasive Surgery Breast and Colorectal Surgery Office:   [phone] Pager:   [phone]

## 2012-11-10 NOTE — Patient Instructions (Signed)
You appear to be healing from your right mastectomy without any obvious surgical problems.  We removed both drains today.  You'll be referred to physical therapy for range of motion exercises  Keep her appointment with Dr. Welton Flakes next week  Return to see Dr. Derrell Lolling in one month, sooner if there are any problems.

## 2012-11-14 ENCOUNTER — Encounter (INDEPENDENT_AMBULATORY_CARE_PROVIDER_SITE_OTHER): Payer: PRIVATE HEALTH INSURANCE | Admitting: General Surgery

## 2012-11-15 ENCOUNTER — Encounter: Payer: Self-pay | Admitting: *Deleted

## 2012-11-15 NOTE — Progress Notes (Signed)
Mailed after appt letter to pt. 

## 2012-11-17 ENCOUNTER — Ambulatory Visit (HOSPITAL_BASED_OUTPATIENT_CLINIC_OR_DEPARTMENT_OTHER): Payer: Medicare Other | Admitting: Oncology

## 2012-11-17 ENCOUNTER — Encounter: Payer: Self-pay | Admitting: Oncology

## 2012-11-17 ENCOUNTER — Telehealth: Payer: Self-pay | Admitting: Oncology

## 2012-11-17 VITALS — BP 118/71 | HR 106 | Temp 98.0°F | Resp 18 | Ht 65.0 in | Wt 105.5 lb

## 2012-11-17 DIAGNOSIS — C50419 Malignant neoplasm of upper-outer quadrant of unspecified female breast: Secondary | ICD-10-CM

## 2012-11-17 DIAGNOSIS — C50411 Malignant neoplasm of upper-outer quadrant of right female breast: Secondary | ICD-10-CM

## 2012-11-17 MED ORDER — TAMOXIFEN CITRATE 20 MG PO TABS: 20.0000 mg | ORAL_TABLET | Freq: Every day | ORAL | Status: DC

## 2012-11-17 NOTE — Patient Instructions (Addendum)
#1 we discussed your final pathology today.  #2 we discussed treatment options including adjuvant chemotherapy followed by antiestrogen therapy. However he declined chemotherapy.  #3 I have therefore recommended tamoxifen 20 mg daily for a total of 10 years duration. We discussed side effects of this. Including but not limited to hot flashes DVTs endometrial carcinoma or uterine bleeding.  #4 I will plan on seeing you back in 3 months time. Of course I can see you sooner if need arises.  Tamoxifen oral tablet What is this medicine? TAMOXIFEN (ta MOX i fen) blocks the effects of estrogen. It is commonly used to treat breast cancer. It is also used to decrease the chance of breast cancer coming back in women who have received treatment for the disease. It may also help prevent breast cancer in women who have a high risk of developing breast cancer. This medicine may be used for other purposes; ask your health care provider or pharmacist if you have questions. What should I tell my health care provider before I take this medicine? They need to know if you have any of these conditions: -blood clots -blood disease -cataracts or impaired eyesight -endometriosis -high calcium levels -high cholesterol -irregular menstrual cycles -liver disease -stroke -uterine fibroids -an unusual or allergic reaction to tamoxifen, other medicines, foods, dyes, or preservatives -pregnant or trying to get pregnant -breast-feeding How should I use this medicine? Take this medicine by mouth with a glass of water. Follow the directions on the prescription label. You can take it with or without food. Take your medicine at regular intervals. Do not take your medicine more often than directed. Do not stop taking except on your doctor's advice. A special MedGuide will be given to you by the pharmacist with each prescription and refill. Be sure to read this information carefully each time. Talk to your pediatrician  regarding the use of this medicine in children. While this drug may be prescribed for selected conditions, precautions do apply. Overdosage: If you think you have taken too much of this medicine contact a poison control center or emergency room at once. NOTE: This medicine is only for you. Do not share this medicine with others. What if I miss a dose? If you miss a dose, take it as soon as you can. If it is almost time for your next dose, take only that dose. Do not take double or extra doses. What may interact with this medicine? -aminoglutethimide -bromocriptine -chemotherapy drugs -female hormones, like estrogens and birth control pills -letrozole -medroxyprogesterone -phenobarbital -rifampin -warfarin This list may not describe all possible interactions. Give your health care provider a list of all the medicines, herbs, non-prescription drugs, or dietary supplements you use. Also tell them if you smoke, drink alcohol, or use illegal drugs. Some items may interact with your medicine. What should I watch for while using this medicine? Visit your doctor or health care professional for regular checks on your progress. You will need regular pelvic exams, breast exams, and mammograms. If you are taking this medicine to reduce your risk of getting breast cancer, you should know that this medicine does not prevent all types of breast cancer. If breast cancer or other problems occur, there is no guarantee that it will be found at an early stage. Do not become pregnant while taking this medicine or for 2 months after stopping this medicine. Stop taking this medicine if you get pregnant or think you are pregnant and contact your doctor. This medicine may harm your  unborn baby. Women who can possibly become pregnant should use birth control methods that do not use hormones during tamoxifen treatment and for 2 months after therapy has stopped. Talk with your health care provider for birth control  advice. Do not breast feed while taking this medicine. What side effects may I notice from receiving this medicine? Side effects that you should report to your doctor or health care professional as soon as possible: -changes in vision (blurred vision) -changes in your menstrual cycle -difficulty breathing or shortness of breath -difficulty walking or talking -new breast lumps -numbness -pelvic pain or pressure -redness, blistering, peeling or loosening of the skin, including inside the mouth -skin rash or itching (hives) -sudden chest pain -swelling of lips, face, or tongue -swelling, pain or tenderness in your calf or leg -unusual bruising or bleeding -vaginal discharge that is bloody, brown, or rust -weakness -yellowing of the whites of the eyes or skin Side effects that usually do not require medical attention (report to your doctor or health care professional if they continue or are bothersome): -fatigue -hair loss, although uncommon and is usually mild -headache -hot flashes -impotence (in men) -nausea, vomiting (mild) -vaginal discharge (white or clear) This list may not describe all possible side effects. Call your doctor for medical advice about side effects. You may report side effects to FDA at 1-800-FDA-1088. Where should I keep my medicine? Keep out of the reach of children. Store at room temperature between 20 and 25 degrees C (68 and 77 degrees F). Protect from light. Keep container tightly closed. Throw away any unused medicine after the expiration date. NOTE: This sheet is a summary. It may not cover all possible information. If you have questions about this medicine, talk to your doctor, pharmacist, or health care provider.  2012, Elsevier/Gold Standard. (11/09/2007 12:01:56 PM)

## 2012-11-17 NOTE — Telephone Encounter (Signed)
, °

## 2012-11-23 ENCOUNTER — Telehealth: Payer: Self-pay | Admitting: Genetic Counselor

## 2012-11-23 NOTE — Telephone Encounter (Signed)
LM W/HUSBAND THAT GENETIC APPT HAS BEEN CANCEL.

## 2012-11-26 NOTE — Progress Notes (Signed)
OFFICE PROGRESS NOTE  CC**  Melody Divine, MD [address], [address] [place] [address] Dr. Claud Kelp  Dr. Dorothy Puffer  DIAGNOSIS: 79 year old female with new diagnosis of breast cancer of the right breast. Patient was seen at multidisciplinary breast clinic  STAGE:  Breast cancer of upper-outer quadrant of right female breast  Primary site: Breast (Right)  Staging method: AJCC 7th Edition  Clinical: Stage IIA (T2, N0, cM0)  Summary: Stage IIA (T2, N0, cM0)   PRIOR THERAPY:  #1 patient Felt a lump in her right breast about a month ago. Her last mammogram was about 2 years ago. Patient had a mammogram and right breast ultrasound performed on 09/07/2012. This revealed a 3.3 x 2.2 x 1.6 cm mass with calcifications in the upper outer quadrant of the right breast. Ultrasound was poorly defined and stated to be at 938 cm from the nipple and to be 2.2 cm. The right axilla looked normal by ultrasound. Patient had image guided biopsy performed that showed a receptor-positive, HER-2-negative invasive ductal carcinoma. MRI showed a solitary finding in the right breast measuring 3.3 cm in size extending all the way back to the pectoralis fascia. But it did not invade them muscle.  #2 she is now status post mastectomy performed on 10/31/2012 which revealed a grade 2 invasive ductal carcinoma measuring 3.5 cm. Tumor was ER positive PR positive HER-2/neu negative with a Ki-67 13% 3 sentinel nodes were negative for metastatic disease.  #3 patient will now begin postmastectomy adjuvant antiestrogen therapy with tamoxifen 20 mg daily for a total of 10 years. We discussed the side effects of this.  CURRENT THERAPY: tamoxifen 20 mg daily starting 11/17/2012  INTERVAL HISTORY: Melody Tran 79 y.o. female returns for followup visit today. Overall she's doing well without any complaints. She tolerated the mastectomy very nicely without any problems. She has no nausea vomiting  fevers chills night sweats she does have some aches and pains. She denies any hematuria hematochezia melena hemoptysis hematemesis. She has no vaginal bleeding no lower extremity swelling or edema. Remainder of the 10 point review of systems is negative.  MEDICAL HISTORY: Past Medical History  Diagnosis Date  . Hypertension   . Anxiety   . GERD (gastroesophageal reflux disease)   . Diabetes mellitus without complication   . Osteoporosis   . Arthritis     ALLERGIES:  has No Known Allergies.  MEDICATIONS:  Current Outpatient Prescriptions  Medication Sig Dispense Refill  . ALPRAZolam (XANAX) 0.5 MG tablet Take 0.25 mg by mouth at bedtime.       . Artificial Tear Solution (TEARS NATURALE OP) Place 1 drop into both eyes 4 (four) times daily. For Rosacea of eyes      . Biotin 1000 MCG tablet Take 1,000 mcg by mouth daily.      . Calcium Carb-Cholecalciferol (CALCIUM 500 +D) 500-400 MG-UNIT TABS Take 1 tablet by mouth 2 (two) times daily.      . cholecalciferol (VITAMIN D) 1000 UNITS tablet Take 1,000 Units by mouth daily.      Marland Kitchen esomeprazole (NEXIUM) 20 MG capsule Take 20 mg by mouth daily before breakfast.      . KRILL OIL PO Take 353 mg by mouth daily.      Marland Kitchen lisinopril (PRINIVIL,ZESTRIL) 5 MG tablet Take 2.5 mg by mouth daily with supper.       . loratadine (CLARITIN) 10 MG tablet Take 10 mg by mouth daily. Alavert-Antihistamine      .  Magnesium 250 MG TABS Take 250 mg by mouth daily.       Marland Kitchen PRESCRIPTION MEDICATION Apply 1 application topically 4 (four) times daily. Diltiazem cream 2% compounded at [facility] (to reduce spasm of anal sphincter)      . acetaminophen (TYLENOL) 325 MG tablet Take 325 mg by mouth daily as needed for pain.       Marland Kitchen HYDROcodone-acetaminophen (NORCO/VICODIN) 5-325 MG per tablet Take 1-2 tablets by mouth every 4 (four) hours as needed.  30 tablet  1  . tamoxifen (NOLVADEX) 20 MG tablet Take 1 tablet (20 mg total) by mouth daily.  90 tablet  7   No  current facility-administered medications for this visit.    SURGICAL HISTORY:  Past Surgical History  Procedure Laterality Date  . Breast surgery    . Eye surgery    . Cholecystectomy    . Appendectomy    . Tonsillectomy  1937  . Knee surgery  2005  . Tonsillectomy    . Thumb arthroscopy Left   . Mastectomy Right 10/31/12  . Mastectomy w/ sentinel node biopsy Right 10/31/2012    Procedure: Right total MASTECTOMY WITH right axillary SENTINEL LYMPH NODE BIOPSY;  Surgeon: Ernestene Mention, MD;  Location: [facility] OR;  Service: General;  Laterality: Right;    REVIEW OF SYSTEMS:  Pertinent items are noted in HPI.   HEALTH MAINTENANCE:  PHYSICAL EXAMINATION: Blood pressure 118/71, pulse 106, temperature 98 F (36.7 C), temperature source Oral, resp. rate 18, height 5\' 5"  (1.651 m), weight 105 lb 8 oz (47.854 kg). Body mass index is 17.56 kg/(m^2). ECOG PERFORMANCE STATUS: 0 - Asymptomatic   General appearance: alert, cooperative and appears stated age Lymph nodes: Cervical, supraclavicular, and axillary nodes normal. Resp: clear to auscultation bilaterally and normal percussion bilaterally Back: symmetric, no curvature. ROM normal. No CVA tenderness. Cardio: regular rate and rhythm GI: soft, non-tender; bowel sounds normal; no masses,  no organomegaly Extremities: extremities normal, atraumatic, no cyanosis or edema Neurologic: Grossly normal   LABORATORY DATA: Lab Results  Component Value Date   WBC 10.9* 10/23/2012   HGB 13.7 10/23/2012   HCT 39.4 10/23/2012   MCV 88.7 10/23/2012   PLT 277 10/23/2012      Chemistry      Component Value Date/Time   NA 132* 10/23/2012 1426   NA 135* 09/27/2012 1222   K 4.1 10/23/2012 1426   K 4.9 09/27/2012 1222   CL 95* 10/23/2012 1426   CO2 29 10/23/2012 1426   CO2 29 09/27/2012 1222   BUN 15 10/23/2012 1426   BUN 14.6 09/27/2012 1222   CREATININE 0.80 10/23/2012 1426   CREATININE 0.8 09/27/2012 1222      Component Value Date/Time   CALCIUM 9.6  10/23/2012 1426   CALCIUM 9.9 09/27/2012 1222   ALKPHOS 65 10/23/2012 1426   ALKPHOS 75 09/27/2012 1222   AST 19 10/23/2012 1426   AST 21 09/27/2012 1222   ALT 22 10/23/2012 1426   ALT 28 09/27/2012 1222   BILITOT 0.7 10/23/2012 1426   BILITOT 0.88 09/27/2012 1222     ADDITIONAL INFORMATION: 3. CHROMOGENIC IN-SITU HYBRIDIZATION Results: HER-2/NEU BY CISH - NO AMPLIFICATION OF HER-2 DETECTED. RESULT RATIO OF HER2: CEP 17 SIGNALS 1.27 AVERAGE HER2 COPY NUMBER PER CELL 1.65 REFERENCE RANGE NEGATIVE HER2/Chr17 Ratio <2.0 and Average HER2 copy number <4.0 EQUIVOCAL HER2/Chr17 Ratio <2.0 and Average HER2 copy number 4.0 and <6.0 POSITIVE HER2/Chr17 Ratio >=2.0 and/or Average HER2 copy number >=6.0 JOSHUA  KISH MD Pathologist, Electronic Signature ( Signed 11/08/2012) FINAL DIAGNOSIS Diagnosis 1. Lymph node, sentinel, biopsy, right, axillary #1 - THERE IS NO EVIDENCE OF CARCINOMA IN 1 OF 1 LYMPH NODE (0/1). 2. Lymph node, sentinel, biopsy, right, axillary #2 - THERE IS NO EVIDENCE OF CARCINOMA IN 1 OF 1 LYMPH NODE (0/1). 3. Breast, simple mastectomy, right - INVASIVE DUCTAL CARCINOMA, GRADE II/III, SPANNING 3.5 CM. - LYMPHOVASCULAR INVASION IS IDENTIFIED. - DUCTAL CARCINOMA IN SITU WITH CALCIFICATIONS, LOW GRADE. - THE SURGICAL RESECTION MARGINS ARE NEGATIVE FOR CARCINOMA. - SEE ONCOLOGY TABLE BELOW. 1 of 3 FINAL for Melody Tran, Melody Tran ([id number]) Microscopic Comment 3. BREAST, INVASIVE TUMOR, WITH LYMPH NODE SAMPLING Specimen, including laterality: Right breast. Procedure: Simple mastectomy. Grade: II Tubule formation: 2 Nuclear pleomorphism: 2 Mitotic:2 Tumor size (gross measurement): 3.5 cm Margins: Negative for carcinoma. Invasive, distance to closest margin: 0.3 cm to the posterior margin (gross measurement). In-situ, distance to closest margin: Greater than 0.3 cm to all margins (gross measurement) Lymphovascular invasion: Present. Ductal carcinoma in situ: Present. Grade:  Low grade. Extensive intraductal component: No. Lobular neoplasia: Not identified. Tumor focality: Unifocal Treatment effect: N/A Extent of tumor: Confined to breast parenchyma. Lymph nodes: # examined: 2 Lymph nodes with metastasis: 0 Breast prognostic profile: [id number] Estrogen receptor: 100%, strong staining intensity. Progesterone receptor: 98%, strong staining intensity Her 2 neu: No amplification was detected. The ratio was 0.81. Her2 Will be repeated on the current case and the results reported separated. Ki-67: 13% Non-neoplastic breast: Fibrocystic changes. TNM: pT2, pN0. (JBK:gt, 11/01/12) Pecola Leisure MD Pathologist, Electronic Signature (Case signed 11/01/2012) Intraoperative Diag  RADIOGRAPHIC STUDIES:  Chest 2 View  10/31/2012   CLINICAL DATA:  Invasive right breast carcinoma. Pre-op respiratory exam.  EXAM: CHEST  2 VIEW  COMPARISON:  03/21/2012  FINDINGS: Heart size is normal. Pulmonary hyperinflation again noted, however both lungs are clear. No evidence of pleural effusion. No mass or lymphadenopathy identified. Mild thoracic dextroscoliosis remains stable.  IMPRESSION: Stable exam. No active disease.   Electronically Signed   By: Myles Rosenthal   On: 10/31/2012 07:39   Nm Sentinel Node Inj-no Rpt (breast)  10/31/2012   CLINICAL DATA: Cancer right breast   Sulfur colloid was injected intradermally by the nuclear medicine  technologist for breast cancer sentinel node localization.     ASSESSMENT: 79 year old female with  #1 stage II (T2 N0) invasive ductal carcinoma status post simple mastectomy with sentinel lymph node biopsy. All sentinel nodes were negative. Tumor was ER positive PR positive HER-2/neu negative with a low Ki-67.  #2 patient does not need radiation therapy since she has had a mastectomy and the nodes are negative.  #3 I have recommended adjuvant antiestrogen therapy with curative intent. She will begin tamoxifen 20 mg daily. We discussed risks  benefits and side effects of tamoxifen. She understands she needs to continue having her eyes examined she is to report any swelling in her legs as well as any vaginal bleeding.   PLAN:   #1 proceed with tamoxifen 20 mg daily.  #2 I will see the patient back in 3 months time for followup or sooner if need arises   All questions were answered. The patient knows to call the clinic with any problems, questions or concerns. We can certainly see the patient much sooner if necessary.  I spent 25 minutes counseling the patient face to face. The total time spent in the appointment was 30 minutes.    Drue Second, MD Medical/Oncology [facility] [phone] (beeper) [phone] (Office)

## 2012-11-27 ENCOUNTER — Encounter: Payer: Medicare Other | Admitting: Genetic Counselor

## 2012-11-27 ENCOUNTER — Ambulatory Visit: Payer: Medicare Other | Attending: General Surgery | Admitting: Physical Therapy

## 2012-11-27 ENCOUNTER — Other Ambulatory Visit: Payer: Medicare Other | Admitting: Lab

## 2012-11-27 DIAGNOSIS — R293 Abnormal posture: Secondary | ICD-10-CM | POA: Insufficient documentation

## 2012-11-27 DIAGNOSIS — IMO0001 Reserved for inherently not codable concepts without codable children: Secondary | ICD-10-CM | POA: Insufficient documentation

## 2012-11-27 DIAGNOSIS — C50919 Malignant neoplasm of unspecified site of unspecified female breast: Secondary | ICD-10-CM | POA: Insufficient documentation

## 2012-11-27 DIAGNOSIS — M25569 Pain in unspecified knee: Secondary | ICD-10-CM | POA: Insufficient documentation

## 2012-11-27 DIAGNOSIS — Z01818 Encounter for other preprocedural examination: Secondary | ICD-10-CM | POA: Insufficient documentation

## 2012-11-28 ENCOUNTER — Encounter (INDEPENDENT_AMBULATORY_CARE_PROVIDER_SITE_OTHER): Payer: Self-pay | Admitting: General Surgery

## 2012-11-28 ENCOUNTER — Ambulatory Visit (INDEPENDENT_AMBULATORY_CARE_PROVIDER_SITE_OTHER): Payer: PRIVATE HEALTH INSURANCE | Admitting: General Surgery

## 2012-11-28 VITALS — BP 112/70 | HR 120 | Temp 97.2°F | Resp 18 | Ht 65.0 in | Wt 106.0 lb

## 2012-11-28 DIAGNOSIS — C50419 Malignant neoplasm of upper-outer quadrant of unspecified female breast: Secondary | ICD-10-CM

## 2012-11-28 DIAGNOSIS — C50411 Malignant neoplasm of upper-outer quadrant of right female breast: Secondary | ICD-10-CM

## 2012-11-28 NOTE — Progress Notes (Signed)
Patient ID: Melody Tran, female   DOB: 22-Mar-1928, 79 y.o.   MRN: [id number] History: This patient comes in for a wound check. She underwent right mastectomy and sentinel node biopsy on 10/31/2012.  Final pathology shows invasive ductal carcinoma and associated DCIS, 3.5 cm diameter, receptor positive, HER-2-negative, stage T2, N0. Stage II A.  She has seen Dr. Drue Second, and has been started on tamoxifen. She has begun physical therapy, but just today. She thinks this is helped her. She noticed a pink area and the wound that she wanted me to check  Exam: Patient is alert. No distress. Family member with her. Right mastectomy skin flaps looked good. There is no fluid collection. There is no skin necrosis. There is a 2.5 cm area of erythema localized in the lateral third of the mastectomy incision. There is no purulent drainage. This is not really tender but does blanch and  feels a little bit warm.  Assessment: Invasive ductal carcinoma right breast, 3:00 position, 3.5 cm in diameter, receptor positive, HER-2-negative. Pathologic stage T2, N0. Stage IIA.  4 weeks postop right mastectomy and sentinel node biopsy. Question localized area of cellulitis, low-grade, lateral mastectomy skin flap  Plan: Doxycycline 100 mg by mouth twice a day x7 days Return to see me in 2 weeks if this has not completely resolved, otherwise see me in 3 months Continue tamoxifen and followup with Dr. Cherene Altes Continue physical therapy   Angelia Mould. Derrell Lolling, M.D., [facility] General and Minimally invasive Surgery Breast and Colorectal Surgery Office:   [phone] Pager:   [phone]

## 2012-11-28 NOTE — Patient Instructions (Signed)
Your right mastectomy wound actually looks very good. There is a small pink area. This may be a very low-grade infection.  You have been given a prescription for doxycycline for 7 days. This should take care of the redness and it should go away within 5-7 days.  If the redness does not go away I will see you in 2 weeks.  If everything gets better, I will see you  back in 3 months.

## 2012-12-12 ENCOUNTER — Encounter (INDEPENDENT_AMBULATORY_CARE_PROVIDER_SITE_OTHER): Payer: PRIVATE HEALTH INSURANCE | Admitting: General Surgery

## 2012-12-14 ENCOUNTER — Encounter (INDEPENDENT_AMBULATORY_CARE_PROVIDER_SITE_OTHER): Payer: PRIVATE HEALTH INSURANCE | Admitting: General Surgery

## 2012-12-28 ENCOUNTER — Ambulatory Visit: Payer: Medicare Other | Attending: General Surgery | Admitting: Physical Therapy

## 2012-12-28 DIAGNOSIS — M25569 Pain in unspecified knee: Secondary | ICD-10-CM | POA: Insufficient documentation

## 2012-12-28 DIAGNOSIS — R293 Abnormal posture: Secondary | ICD-10-CM | POA: Insufficient documentation

## 2012-12-28 DIAGNOSIS — C50919 Malignant neoplasm of unspecified site of unspecified female breast: Secondary | ICD-10-CM | POA: Insufficient documentation

## 2012-12-28 DIAGNOSIS — Z01818 Encounter for other preprocedural examination: Secondary | ICD-10-CM | POA: Insufficient documentation

## 2012-12-28 DIAGNOSIS — IMO0001 Reserved for inherently not codable concepts without codable children: Secondary | ICD-10-CM | POA: Insufficient documentation

## 2013-02-21 ENCOUNTER — Other Ambulatory Visit (HOSPITAL_BASED_OUTPATIENT_CLINIC_OR_DEPARTMENT_OTHER): Payer: Medicare Other

## 2013-02-21 ENCOUNTER — Ambulatory Visit (HOSPITAL_BASED_OUTPATIENT_CLINIC_OR_DEPARTMENT_OTHER): Payer: Medicare Other | Admitting: Oncology

## 2013-02-21 ENCOUNTER — Telehealth: Payer: Self-pay | Admitting: Oncology

## 2013-02-21 VITALS — BP 139/71 | HR 90 | Temp 98.3°F | Resp 20 | Ht 65.0 in | Wt 107.3 lb

## 2013-02-21 DIAGNOSIS — C50419 Malignant neoplasm of upper-outer quadrant of unspecified female breast: Secondary | ICD-10-CM

## 2013-02-21 DIAGNOSIS — C50411 Malignant neoplasm of upper-outer quadrant of right female breast: Secondary | ICD-10-CM

## 2013-02-21 DIAGNOSIS — M7918 Myalgia, other site: Secondary | ICD-10-CM

## 2013-02-21 DIAGNOSIS — R634 Abnormal weight loss: Secondary | ICD-10-CM

## 2013-02-21 DIAGNOSIS — IMO0001 Reserved for inherently not codable concepts without codable children: Secondary | ICD-10-CM

## 2013-02-21 LAB — CBC WITH DIFFERENTIAL/PLATELET
Eosinophils Absolute: 0.1 10*3/uL (ref 0.0–0.5)
HCT: 36.6 % (ref 34.8–46.6)
LYMPH%: 26.3 % (ref 14.0–49.7)
MONO#: 0.5 10*3/uL (ref 0.1–0.9)
NEUT#: 4.8 10*3/uL (ref 1.5–6.5)
NEUT%: 63.8 % (ref 38.4–76.8)
Platelets: 200 10*3/uL (ref 145–400)
WBC: 7.5 10*3/uL (ref 3.9–10.3)

## 2013-02-21 LAB — COMPREHENSIVE METABOLIC PANEL (CC13)
Anion Gap: 8 mEq/L (ref 3–11)
BUN: 17.8 mg/dL (ref 7.0–26.0)
CO2: 29 mEq/L (ref 22–29)
Creatinine: 0.8 mg/dL (ref 0.6–1.1)
Glucose: 110 mg/dl (ref 70–140)
Total Bilirubin: 0.77 mg/dL (ref 0.20–1.20)

## 2013-02-21 NOTE — Progress Notes (Signed)
OFFICE PROGRESS NOTE  CC**  [name], MD [address] [place] [address] Dr. Claud Kelp  Dr. Dorothy Puffer  DIAGNOSIS: 79 year old female with new diagnosis of breast cancer of the right breast. Patient was seen at multidisciplinary breast clinic  STAGE:  Breast cancer of upper-outer quadrant of right female breast  Primary site: Breast (Right)  Staging method: AJCC 7th Edition  Clinical: Stage IIA (T2, N0, cM0)  Summary: Stage IIA (T2, N0, cM0)   PRIOR THERAPY:  #1 patient Felt a lump in her right breast about a month ago. Her last mammogram was about 2 years ago. Patient had a mammogram and right breast ultrasound performed on 09/07/2012. This revealed a 3.3 x 2.2 x 1.6 cm mass with calcifications in the upper outer quadrant of the right breast. Ultrasound was poorly defined and stated to be at 9  cm from the nipple and to be 2.2 cm. The right axilla looked normal by ultrasound. Patient had image guided biopsy performed that showed a receptor-positive, HER-2-negative invasive ductal carcinoma. MRI showed a solitary finding in the right breast measuring 3.3 cm in size extending all the way back to the pectoralis fascia. But it did not invade them muscle.  #2 she is now status post mastectomy performed on 10/31/2012 which revealed a grade 2 invasive ductal carcinoma measuring 3.5 cm. Tumor was ER positive PR positive HER-2/neu negative with a Ki-67 13% 3 sentinel nodes were negative for metastatic disease.  #3postmastectomy adjuvant antiestrogen therapy with tamoxifen 20 mg daily for a total of 10 years. We discussed the side effects of this. Discontinued 02/21/2013 due to side effects  CURRENT THERAPY:  Discontinued tamoxifen  INTERVAL HISTORY: Melody Tran 79 y.o. female returns for followup visit today. Patient's primary complaint today is generalized aches and pains and weight loss. She states it has become more pronounced since she has been  on tamoxifen. She is worried about her osteoporosis as well. She overall does not feel well. She denies any fevers chills night sweats. She is not taking any pain medications for her aches and pains. She was offered to get a bone scan to make sure she does not have metastatic disease. Patient declined. She has no bleeding vaginally. No swelling in her lower extremities. She does have some headaches off-and-on. Remainder of the 10 point review of systems is negative.  MEDICAL HISTORY: Past Medical History  Diagnosis Date  . Hypertension   . Anxiety   . GERD (gastroesophageal reflux disease)   . Diabetes mellitus without complication   . Osteoporosis   . Arthritis     ALLERGIES:  has No Known Allergies.  MEDICATIONS:  Current Outpatient Prescriptions  Medication Sig Dispense Refill  . acetaminophen (TYLENOL) 325 MG tablet Take 325 mg by mouth daily as needed for pain.       Marland Kitchen ALPRAZolam (XANAX) 0.5 MG tablet Take 0.25 mg by mouth at bedtime.       . Artificial Tear Solution (TEARS NATURALE OP) Place 1 drop into both eyes 4 (four) times daily. For Rosacea of eyes      . Biotin 1000 MCG tablet Take 1,000 mcg by mouth daily.      . Calcium Carb-Cholecalciferol (CALCIUM 500 +D) 500-400 MG-UNIT TABS Take 1 tablet by mouth 2 (two) times daily.      . cholecalciferol (VITAMIN D) 1000 UNITS tablet Take 1,000 Units by mouth daily.      Marland Kitchen esomeprazole (NEXIUM) 20 MG capsule Take 20  mg by mouth daily before breakfast.      . KRILL OIL PO Take 353 mg by mouth daily.      Marland Kitchen lisinopril (PRINIVIL,ZESTRIL) 5 MG tablet Take 2.5 mg by mouth daily with supper.       . loratadine (CLARITIN) 10 MG tablet Take 10 mg by mouth daily. Alavert-Antihistamine      . Magnesium 250 MG TABS Take 250 mg by mouth daily.       Marland Kitchen PRESCRIPTION MEDICATION Apply 1 application topically 4 (four) times daily. Diltiazem cream 2% compounded at [facility] (to reduce spasm of anal sphincter)      . tamoxifen (NOLVADEX) 20  MG tablet Take 1 tablet (20 mg total) by mouth daily.  90 tablet  7  . chlorhexidine (PERIDEX) 0.12 % solution       . HYDROcodone-acetaminophen (NORCO/VICODIN) 5-325 MG per tablet Take 1-2 tablets by mouth every 4 (four) hours as needed.  30 tablet  1   No current facility-administered medications for this visit.    SURGICAL HISTORY:  Past Surgical History  Procedure Laterality Date  . Breast surgery    . Eye surgery    . Cholecystectomy    . Appendectomy    . Tonsillectomy  1937  . Knee surgery  2005  . Tonsillectomy    . Thumb arthroscopy Left   . Mastectomy Right 10/31/12  . Mastectomy w/ sentinel node biopsy Right 10/31/2012    Procedure: Right total MASTECTOMY WITH right axillary SENTINEL LYMPH NODE BIOPSY;  Surgeon: Ernestene Mention, MD;  Location: [facility] OR;  Service: General;  Laterality: Right;    REVIEW OF SYSTEMS:  Pertinent items are noted in HPI.   HEALTH MAINTENANCE:  PHYSICAL EXAMINATION: Blood pressure 139/71, pulse 90, temperature 98.3 F (36.8 C), temperature source Oral, resp. rate 20, height 5\' 5"  (1.651 m), weight 107 lb 4.8 oz (48.671 kg). Body mass index is 17.86 kg/(m^2). ECOG PERFORMANCE STATUS: 0 - Asymptomatic   General appearance: alert, cooperative and appears stated age Lymph nodes: Cervical, supraclavicular, and axillary nodes normal. Resp: clear to auscultation bilaterally and normal percussion bilaterally Back: symmetric, no curvature. ROM normal. No CVA tenderness. Cardio: regular rate and rhythm GI: soft, non-tender; bowel sounds normal; no masses,  no organomegaly Extremities: extremities normal, atraumatic, no cyanosis or edema Neurologic: Grossly normal   LABORATORY DATA: Lab Results  Component Value Date   WBC 7.5 02/21/2013   HGB 12.3 02/21/2013   HCT 36.6 02/21/2013   MCV 91.6 02/21/2013   PLT 200 02/21/2013      Chemistry      Component Value Date/Time   NA 139 02/21/2013 1022   NA 132* 10/23/2012 1426   K 4.4 02/21/2013  1022   K 4.1 10/23/2012 1426   CL 95* 10/23/2012 1426   CO2 29 02/21/2013 1022   CO2 29 10/23/2012 1426   BUN 17.8 02/21/2013 1022   BUN 15 10/23/2012 1426   CREATININE 0.8 02/21/2013 1022   CREATININE 0.80 10/23/2012 1426      Component Value Date/Time   CALCIUM 9.3 02/21/2013 1022   CALCIUM 9.6 10/23/2012 1426   ALKPHOS 51 02/21/2013 1022   ALKPHOS 65 10/23/2012 1426   AST 21 02/21/2013 1022   AST 19 10/23/2012 1426   ALT 23 02/21/2013 1022   ALT 22 10/23/2012 1426   BILITOT 0.77 02/21/2013 1022   BILITOT 0.7 10/23/2012 1426     ADDITIONAL INFORMATION: 3. CHROMOGENIC IN-SITU HYBRIDIZATION Results: HER-2/NEU BY CISH -  NO AMPLIFICATION OF HER-2 DETECTED. RESULT RATIO OF HER2: CEP 17 SIGNALS 1.27 AVERAGE HER2 COPY NUMBER PER CELL 1.65 REFERENCE RANGE NEGATIVE HER2/Chr17 Ratio <2.0 and Average HER2 copy number <4.0 EQUIVOCAL HER2/Chr17 Ratio <2.0 and Average HER2 copy number 4.0 and <6.0 POSITIVE HER2/Chr17 Ratio >=2.0 and/or Average HER2 copy number >=6.0 Pecola Leisure MD Pathologist, Electronic Signature ( Signed 11/08/2012) FINAL DIAGNOSIS Diagnosis 1. Lymph node, sentinel, biopsy, right, axillary #1 - THERE IS NO EVIDENCE OF CARCINOMA IN 1 OF 1 LYMPH NODE (0/1). 2. Lymph node, sentinel, biopsy, right, axillary #2 - THERE IS NO EVIDENCE OF CARCINOMA IN 1 OF 1 LYMPH NODE (0/1). 3. Breast, simple mastectomy, right - INVASIVE DUCTAL CARCINOMA, GRADE II/III, SPANNING 3.5 CM. - LYMPHOVASCULAR INVASION IS IDENTIFIED. - DUCTAL CARCINOMA IN SITU WITH CALCIFICATIONS, LOW GRADE. - THE SURGICAL RESECTION MARGINS ARE NEGATIVE FOR CARCINOMA. - SEE ONCOLOGY TABLE BELOW. 1 of 3 FINAL for Melody Tran, Melody Tran ([id number]) Microscopic Comment 3. BREAST, INVASIVE TUMOR, WITH LYMPH NODE SAMPLING Specimen, including laterality: Right breast. Procedure: Simple mastectomy. Grade: II Tubule formation: 2 Nuclear pleomorphism: 2 Mitotic:2 Tumor size (gross measurement): 3.5 cm Margins: Negative  for carcinoma. Invasive, distance to closest margin: 0.3 cm to the posterior margin (gross measurement). In-situ, distance to closest margin: Greater than 0.3 cm to all margins (gross measurement) Lymphovascular invasion: Present. Ductal carcinoma in situ: Present. Grade: Low grade. Extensive intraductal component: No. Lobular neoplasia: Not identified. Tumor focality: Unifocal Treatment effect: N/A Extent of tumor: Confined to breast parenchyma. Lymph nodes: # examined: 2 Lymph nodes with metastasis: 0 Breast prognostic profile: [id number] Estrogen receptor: 100%, strong staining intensity. Progesterone receptor: 98%, strong staining intensity Her 2 neu: No amplification was detected. The ratio was 0.81. Her2 Will be repeated on the current case and the results reported separated. Ki-67: 13% Non-neoplastic breast: Fibrocystic changes. TNM: pT2, pN0. (JBK:gt, 11/01/12) Pecola Leisure MD Pathologist, Electronic Signature (Case signed 11/01/2012) Intraoperative Diag  RADIOGRAPHIC STUDIES:  Chest 2 View  10/31/2012   CLINICAL DATA:  Invasive right breast carcinoma. Pre-op respiratory exam.  EXAM: CHEST  2 VIEW  COMPARISON:  03/21/2012  FINDINGS: Heart size is normal. Pulmonary hyperinflation again noted, however both lungs are clear. No evidence of pleural effusion. No mass or lymphadenopathy identified. Mild thoracic dextroscoliosis remains stable.  IMPRESSION: Stable exam. No active disease.   Electronically Signed   By: Myles Rosenthal   On: 10/31/2012 07:39   Nm Sentinel Node Inj-no Rpt (breast)  10/31/2012   CLINICAL DATA: Cancer right breast   Sulfur colloid was injected intradermally by the nuclear medicine  technologist for breast cancer sentinel node localization.     ASSESSMENT: 79 year old female with  #1 stage II (T2 N0) invasive ductal carcinoma status post simple mastectomy with sentinel lymph node biopsy. All sentinel nodes were negative. Tumor was ER positive PR  positive HER-2/neu negative with a low Ki-67. Patient did not receive radiation therapy because of mastectomy. She was begun on antiestrogen therapy with tamoxifen 20 mg on a daily basis.  #2 patient has been on tamoxifen for about 3 months. She tells me today that she has been having a lot of aches and pains since starting on tamoxifen. I have recommended that we discontinue tamoxifen to see if her aches and pains go away.  #3 weight loss unclear etiology. I did offer to get a bone scan on her to see if she could possibly have metastatic disease. Patient declined. I also offered to send her to a dietitian but patient  declined. She does have a appointment coming out with Dr. Valentina Lucks she is gong to discuss her weight loss with her further. She'll also need a bone density scan and she is gong to have this done with her primary care physician.    PLAN:   #1 patient will discontinue tamoxifen for now to see if her aches and pains go away.  #2 I will plan on seeing her back in 3-4 months time for followup.  #3 patient will keep her appointment with her primary care physician in January for further evaluation of weight loss. She is also due for a bone density scan at that time as well.  All questions were answered. The patient knows to call the clinic with any problems, questions or concerns. We can certainly see the patient much sooner if necessary.  I spent 25 minutes counseling the patient face to face. The total time spent in the appointment was 30 minutes.    Drue Second, MD Medical/Oncology [facility] [phone] (beeper) [phone] (Office)

## 2013-04-27 ENCOUNTER — Encounter (INDEPENDENT_AMBULATORY_CARE_PROVIDER_SITE_OTHER): Payer: Self-pay

## 2013-04-27 ENCOUNTER — Ambulatory Visit (INDEPENDENT_AMBULATORY_CARE_PROVIDER_SITE_OTHER): Payer: PRIVATE HEALTH INSURANCE | Admitting: General Surgery

## 2013-04-27 ENCOUNTER — Encounter (INDEPENDENT_AMBULATORY_CARE_PROVIDER_SITE_OTHER): Payer: Self-pay | Admitting: General Surgery

## 2013-04-27 VITALS — BP 100/62 | HR 84 | Temp 97.9°F | Resp 14 | Ht 65.5 in | Wt 116.0 lb

## 2013-04-27 DIAGNOSIS — C50411 Malignant neoplasm of upper-outer quadrant of right female breast: Secondary | ICD-10-CM

## 2013-04-27 DIAGNOSIS — C50419 Malignant neoplasm of upper-outer quadrant of unspecified female breast: Secondary | ICD-10-CM

## 2013-04-27 NOTE — Patient Instructions (Signed)
Your right mastectomy wound has healed well. All of the lymph node areas are normal. There is no evidence of cancer.  You may wear any clothing that you wish. You may use any deodorant that you wish. It is okay to have blood drawn from the right arm.  You have been given a prescription for a postmastectomy bra and prosthesis.  Be sure to get your left breast mammogram in July, and return to see Dr. Dalbert Batman in the office in August of this year.

## 2013-04-27 NOTE — Progress Notes (Signed)
Patient ID: Melody Tran, female   DOB: [birth year], 80 y.o.   MRN: [id number] History:  This patient comes in for LTFU.Marland Kitchen  She underwent right mastectomy and sentinel node biopsy on 10/31/2012.  Final pathology shows invasive ductal carcinoma and associated DCIS, 3.5 cm diameter, receptor positive, HER-2-negative, stage T2, N0. Stage II A.  She has seen Dr. Marcy Panning, and was started on tamoxifen, But that has been held because of increasing osteoporosis symptoms and joint pain. She feels better now. She has gained about 6 pounds and feels much better about that. She has no wound problems.   Past history, family history, social history, and review of systems are documented on the chart, unchanged, and noncontributory except as described above.  Exam:  Patient is alert. No distress. Good spirits. Right mastectomy skin flaps looked good.. Right axilla feels normal. Range of motion right shoulder is normal. No arm swelling. Lungs clear to auscultation bilaterally Heart regular rate and rhythm. No murmur. No ectopy Neck no adenopathy or mass.   Assessment:  Invasive ductal carcinoma right breast, 3:00 position, 3.5 cm in diameter, receptor positive, HER-2-negative. Pathologic stage T2, N0. Stage IIA.  No evidence of recurrence 6 months postop right mastectomy and sentinel node biopsy  Plan:  Diet, activity, and wound care discussed Prescription for postmastectomy bra and prosthesis given the patient Return to see me in August after she gets her mammograms in July.   Edsel Petrin. Dalbert Batman, M.D., [facility] General and Minimally invasive Surgery Breast and Colorectal Surgery Office:   [phone] Pager:   [phone]

## 2013-06-26 ENCOUNTER — Telehealth: Payer: Self-pay | Admitting: Oncology

## 2013-06-26 NOTE — Telephone Encounter (Signed)
, °

## 2013-07-04 ENCOUNTER — Ambulatory Visit: Payer: Medicare Other | Admitting: Oncology

## 2013-07-04 ENCOUNTER — Other Ambulatory Visit: Payer: Medicare Other

## 2013-07-12 ENCOUNTER — Ambulatory Visit (HOSPITAL_BASED_OUTPATIENT_CLINIC_OR_DEPARTMENT_OTHER): Payer: Medicare Other | Admitting: Hematology and Oncology

## 2013-07-12 ENCOUNTER — Other Ambulatory Visit: Payer: Medicare Other

## 2013-07-12 ENCOUNTER — Ambulatory Visit (HOSPITAL_BASED_OUTPATIENT_CLINIC_OR_DEPARTMENT_OTHER): Payer: Medicare Other

## 2013-07-12 ENCOUNTER — Telehealth: Payer: Self-pay | Admitting: Adult Health

## 2013-07-12 VITALS — BP 112/49 | HR 93 | Temp 98.5°F | Resp 18 | Ht 65.0 in | Wt 118.5 lb

## 2013-07-12 DIAGNOSIS — M899 Disorder of bone, unspecified: Secondary | ICD-10-CM

## 2013-07-12 DIAGNOSIS — C50411 Malignant neoplasm of upper-outer quadrant of right female breast: Secondary | ICD-10-CM

## 2013-07-12 DIAGNOSIS — C50419 Malignant neoplasm of upper-outer quadrant of unspecified female breast: Secondary | ICD-10-CM

## 2013-07-12 DIAGNOSIS — R599 Enlarged lymph nodes, unspecified: Secondary | ICD-10-CM

## 2013-07-12 DIAGNOSIS — M949 Disorder of cartilage, unspecified: Secondary | ICD-10-CM

## 2013-07-12 DIAGNOSIS — Z170 Estrogen receptor positive status [ER+]: Secondary | ICD-10-CM

## 2013-07-12 LAB — CBC WITH DIFFERENTIAL/PLATELET
BASO%: 0.7 % (ref 0.0–2.0)
BASOS ABS: 0.1 10*3/uL (ref 0.0–0.1)
EOS ABS: 0.1 10*3/uL (ref 0.0–0.5)
EOS%: 1.3 % (ref 0.0–7.0)
HCT: 41 % (ref 34.8–46.6)
HGB: 13.7 g/dL (ref 11.6–15.9)
LYMPH%: 26.2 % (ref 14.0–49.7)
MCH: 30.2 pg (ref 25.1–34.0)
MCHC: 33.5 g/dL (ref 31.5–36.0)
MCV: 90.1 fL (ref 79.5–101.0)
MONO#: 0.5 10*3/uL (ref 0.1–0.9)
MONO%: 6.6 % (ref 0.0–14.0)
NEUT#: 5.3 10*3/uL (ref 1.5–6.5)
NEUT%: 65.2 % (ref 38.4–76.8)
PLATELETS: 208 10*3/uL (ref 145–400)
RBC: 4.55 10*6/uL (ref 3.70–5.45)
RDW: 12.4 % (ref 11.2–14.5)
WBC: 8.1 10*3/uL (ref 3.9–10.3)
lymph#: 2.1 10*3/uL (ref 0.9–3.3)

## 2013-07-12 LAB — COMPREHENSIVE METABOLIC PANEL (CC13)
ALBUMIN: 3.9 g/dL (ref 3.5–5.0)
ALK PHOS: 68 U/L (ref 40–150)
ALT: 20 U/L (ref 0–55)
AST: 21 U/L (ref 5–34)
Anion Gap: 8 mEq/L (ref 3–11)
BILIRUBIN TOTAL: 0.69 mg/dL (ref 0.20–1.20)
BUN: 21.1 mg/dL (ref 7.0–26.0)
CO2: 28 mEq/L (ref 22–29)
Calcium: 10.1 mg/dL (ref 8.4–10.4)
Chloride: 104 mEq/L (ref 98–109)
Creatinine: 0.9 mg/dL (ref 0.6–1.1)
GLUCOSE: 107 mg/dL (ref 70–140)
POTASSIUM: 4.6 meq/L (ref 3.5–5.1)
SODIUM: 140 meq/L (ref 136–145)
Total Protein: 6.6 g/dL (ref 6.4–8.3)

## 2013-07-12 NOTE — Telephone Encounter (Signed)
, °

## 2013-07-12 NOTE — Progress Notes (Signed)
OFFICE PROGRESS NOTE  CC**  Melody Casco, MD [address] [place] [address] Dr. Fanny Skates  Dr. Kyung Rudd  Chief complaint: Follow up visit for breast cancer  DIAGNOSIS: 80 year old female with new diagnosis of breast cancer of the right breast. Patient was seen at multidisciplinary breast clinic  STAGE:  Breast cancer of upper-outer quadrant of right female breast  Primary site: Breast (Right)  Staging method: AJCC 7th Edition  Clinical: Stage IIA (T2, N0, cM0)  Summary: Stage IIA (T2, N0, cM0)   PRIOR THERAPY: As per previously documented Dr. Laurelyn Sickle Note:  #1 patient Felt a lump in her right breast about a month ago. Her last mammogram was about 2 years ago. Patient had a mammogram and right breast ultrasound performed on 09/07/2012. This revealed a 3.3 x 2.2 x 1.6 cm mass with calcifications in the upper outer quadrant of the right breast. Ultrasound was poorly defined and stated to be at 9  cm from the nipple and to be 2.2 cm. The right axilla looked normal by ultrasound. Patient had image guided biopsy performed that showed a receptor-positive, HER-2-negative invasive ductal carcinoma. MRI showed a solitary finding in the right breast measuring 3.3 cm in size extending all the way back to the pectoralis fascia. But it did not invade them muscle.  #2 she is now status post mastectomy performed on 10/31/2012 which revealed a grade 2 invasive ductal carcinoma measuring 3.5 cm. Tumor was ER positive PR positive HER-2/neu negative with a Ki-67 13% 3 sentinel nodes were negative for metastatic disease.  #3postmastectomy adjuvant antiestrogen therapy with tamoxifen 20 mg daily for a total of 10 years. We discussed the side effects of this. Discontinued 02/21/2013 due to side effects  CURRENT THERAPY:  Discontinued tamoxifen in December 2014  INTERVAL HISTORY: Melody Tran 80 y.o. female returns for followup visit for breast cancer. Since the  time of last visit, she discontinued her tamoxifen in December 2014, in view of intolerable side effects.  she says that she gained some weight and also her appetite improved.  myalgias and arthralgias resolved. Hot flashes minimized. She has seen surgeon Dr. Dalbert Batman recently. At present she denies any constipation, blood in the stool or blood in the urine, denies any shortness of breath, chest pain, palpitations, fever or chills. She Is able to do exercises regularly and she eats healthy diet   MEDICAL HISTORY: Past Medical History  Diagnosis Date  . Hypertension   . Anxiety   . GERD (gastroesophageal reflux disease)   . Diabetes mellitus without complication   . Osteoporosis   . Arthritis     ALLERGIES:  is allergic to asa.  MEDICATIONS:  Current Outpatient Prescriptions  Medication Sig Dispense Refill  . ALPRAZolam (XANAX) 0.5 MG tablet Take 0.25 mg by mouth at bedtime.       . Artificial Tear Solution (TEARS NATURALE OP) Place 1 drop into both eyes 4 (four) times daily. For Rosacea of eyes      . Biotin 1000 MCG tablet Take 1,000 mcg by mouth daily.      . Blood Glucose Monitoring Suppl (ONE TOUCH ULTRA 2) W/DEVICE KIT       . Calcium Carb-Cholecalciferol (CALCIUM 500 +D) 500-400 MG-UNIT TABS Take 1 tablet by mouth 2 (two) times daily.      . cholecalciferol (VITAMIN D) 1000 UNITS tablet Take 1,000 Units by mouth daily.      . Coenzyme Q10 (CO Q-10) 100 MG CAPS Take by  mouth.      . diltiazem 2 % GEL Apply 1 application topically 2 (two) times daily.      Marland Kitchen KRILL OIL PO Take 353 mg by mouth daily.      Marland Kitchen lisinopril (PRINIVIL,ZESTRIL) 5 MG tablet Take 5 mg by mouth daily with supper.       . loratadine (CLARITIN) 10 MG tablet Take 10 mg by mouth daily. Alavert-Antihistamine      . Magnesium 250 MG TABS Take 250 mg by mouth daily.       . ONE TOUCH ULTRA TEST test strip       . ONETOUCH DELICA LANCETS FINE MISC       . acetaminophen (TYLENOL) 325 MG tablet Take 325 mg by mouth  daily as needed for pain.        No current facility-administered medications for this visit.    SURGICAL HISTORY:  Past Surgical History  Procedure Laterality Date  . Breast surgery    . Eye surgery    . Cholecystectomy    . Appendectomy    . Tonsillectomy  1937  . Knee surgery  2005  . Tonsillectomy    . Thumb arthroscopy Left   . Mastectomy Right 10/31/12  . Mastectomy w/ sentinel node biopsy Right 10/31/2012    Procedure: Right total MASTECTOMY WITH right axillary SENTINEL LYMPH NODE BIOPSY;  Surgeon: Adin Hector, MD;  Location: [facility];  Service: General;  Laterality: Right;    REVIEW OF SYSTEMS: A 10 point review of symptoms were assessed and pertinent symptoms were mentioned in interval history   PHYSICAL EXAMINATION: Blood pressure 112/49, pulse 93, temperature 98.5 F (36.9 C), temperature source Oral, resp. rate 18, height 5' 5"  (1.651 m), weight 118 lb 8 oz (53.751 kg). Body mass index is 19.72 kg/(m^2). ECOG PERFORMANCE STATUS: 0 - Asymptomatic  HEENT PERRLA, sclera anicteric, conjunctiva no pallor, neck supple, no JVD, no thyromegaly, Lymph  node examination: patient does have right axillary lymph node approximately 2 cm in size, firm, freely movable Rs: clear to auscultation no rales CVS: s1 and s2 normal intensity, no murmurs Abdomen :soft, normoactive bowel sounds no hepatosplenomegaly  CNS alert oriented x3 no focal deficits Extremities no edema no cyanosis, no clubbing Psychological: normal mood and affect Breast examination: Right breast status post mastectomy scar noted no nodules appreciated on the chest wall . There is one 2 cm  firm palpable movable lymph node appreciated in the right axilla  LABORATORY DATA: Lab Results  Component Value Date   WBC 8.1 07/12/2013   HGB 13.7 07/12/2013   HCT 41.0 07/12/2013   MCV 90.1 07/12/2013   PLT 208 07/12/2013      Chemistry      Component Value Date/Time   NA 139 02/21/2013 1022   NA 132* 10/23/2012 1426   K  4.4 02/21/2013 1022   K 4.1 10/23/2012 1426   CL 95* 10/23/2012 1426   CO2 29 02/21/2013 1022   CO2 29 10/23/2012 1426   BUN 17.8 02/21/2013 1022   BUN 15 10/23/2012 1426   CREATININE 0.8 02/21/2013 1022   CREATININE 0.80 10/23/2012 1426      Component Value Date/Time   CALCIUM 9.3 02/21/2013 1022   CALCIUM 9.6 10/23/2012 1426   ALKPHOS 51 02/21/2013 1022   ALKPHOS 65 10/23/2012 1426   AST 21 02/21/2013 1022   AST 19 10/23/2012 1426   ALT 23 02/21/2013 1022   ALT 22 10/23/2012 1426   BILITOT 0.77 02/21/2013  1022   BILITOT 0.7 10/23/2012 1426     ADDITIONAL INFORMATION: 3. CHROMOGENIC IN-SITU HYBRIDIZATION Results: HER-2/NEU BY CISH - NO AMPLIFICATION OF HER-2 DETECTED. RESULT RATIO OF HER2: CEP 17 SIGNALS 1.27 AVERAGE HER2 COPY NUMBER PER CELL 1.65 REFERENCE RANGE NEGATIVE HER2/Chr17 Ratio <2.0 and Average HER2 copy number <4.0 EQUIVOCAL HER2/Chr17 Ratio <2.0 and Average HER2 copy number 4.0 and <6.0 POSITIVE HER2/Chr17 Ratio >=2.0 and/or Average HER2 copy number >=6.0 Enid Cutter MD Pathologist, Electronic Signature ( Signed 11/08/2012) FINAL DIAGNOSIS Diagnosis 1. Lymph node, sentinel, biopsy, right, axillary #1 - THERE IS NO EVIDENCE OF CARCINOMA IN 1 OF 1 LYMPH NODE (0/1). 2. Lymph node, sentinel, biopsy, right, axillary #2 - THERE IS NO EVIDENCE OF CARCINOMA IN 1 OF 1 LYMPH NODE (0/1). 3. Breast, simple mastectomy, right - INVASIVE DUCTAL CARCINOMA, GRADE II/III, SPANNING 3.5 CM. - LYMPHOVASCULAR INVASION IS IDENTIFIED. - DUCTAL CARCINOMA IN SITU WITH CALCIFICATIONS, LOW GRADE. - THE SURGICAL RESECTION MARGINS ARE NEGATIVE FOR CARCINOMA. - SEE ONCOLOGY TABLE BELOW. 1 of 3 FINAL for Degroff, Tyesha E ([id number]) Microscopic Comment 3. BREAST, INVASIVE TUMOR, WITH LYMPH NODE SAMPLING Specimen, including laterality: Right breast. Procedure: Simple mastectomy. Grade: II Tubule formation: 2 Nuclear pleomorphism: 2 Mitotic:2 Tumor size (gross measurement): 3.5  cm Margins: Negative for carcinoma. Invasive, distance to closest margin: 0.3 cm to the posterior margin (gross measurement). In-situ, distance to closest margin: Greater than 0.3 cm to all margins (gross measurement) Lymphovascular invasion: Present. Ductal carcinoma in situ: Present. Grade: Low grade. Extensive intraductal component: No. Lobular neoplasia: Not identified. Tumor focality: Unifocal Treatment effect: N/A Extent of tumor: Confined to breast parenchyma. Lymph nodes: # examined: 2 Lymph nodes with metastasis: 0 Breast prognostic profile: [id number] Estrogen receptor: 100%, strong staining intensity. Progesterone receptor: 98%, strong staining intensity Her 2 neu: No amplification was detected. The ratio was 0.81. Her2 Will be repeated on the current case and the results reported separated. Ki-67: 13% Non-neoplastic breast: Fibrocystic changes. TNM: pT2, pN0. (JBK:gt, 11/01/12) Enid Cutter MD Pathologist, Electronic Signature (Case signed 11/01/2012) Intraoperative Diag  RADIOGRAPHIC STUDIES:  Chest 2 View  10/31/2012   CLINICAL DATA:  Invasive right breast carcinoma. Pre-op respiratory exam.  EXAM: CHEST  2 VIEW  COMPARISON:  03/21/2012  FINDINGS: Heart size is normal. Pulmonary hyperinflation again noted, however both lungs are clear. No evidence of pleural effusion. No mass or lymphadenopathy identified. Mild thoracic dextroscoliosis remains stable.  IMPRESSION: Stable exam. No active disease.   Electronically Signed   By: Earle Gell   On: 10/31/2012 07:39   Nm Sentinel Node Inj-no Rpt (breast)  10/31/2012   CLINICAL DATA: Cancer right breast   Sulfur colloid was injected intradermally by the nuclear medicine  technologist for breast cancer sentinel node localization.     ASSESSMENT: 80 year old female with  #1 stage II (T2 N0) invasive ductal carcinoma status post simple mastectomy with sentinel lymph node biopsy. All sentinel nodes were negative. Tumor was  ER positive PR positive HER-2/neu negative with a low Ki-67. Patient did not receive radiation therapy because of mastectomy. She was begun on antiestrogen therapy with tamoxifen 20 mg on a daily basis.  #2 She stopped taking tamoxifen(used for 3 months) in view of intolerable side effects since December 2014 . After stopping  tamoxifen she started feeling much better her quality of life is improved a lot. she doesn't want to take any more tamoxifen. I have offered her aromatase inhibitor therapy and she declined at this time.    #3 right  axillary lymphadenopathy: Will obtain the CT of the chest including right axilla to further delineate the process. if CAT scan shows accesable  lymph nodes for biopsy, we will arrange for  CT-guided biopsy of the right axillary lymph node.  #4 Will obtain CBC and differential and CMP today  #5 Bone densitometry performed in January 2013 showed osteopenia: Continue calcium with vitamin D supplementation. I have asked her to follow up with primary care physician for repeat bone densitometry  #6 left breast mammogram due in July 2015  #6 next followup visit in 3 months with CBC and differential and CMP on the day of next visit   All questions were answered. The patient knows to call the clinic with any problems, questions or concerns. We can certainly see the patient much sooner if necessary.  I spent 20 minutes counseling the patient face to face. The total time spent in the appointment was 30 minutes.    Wilmon Arms , MD Medical/Oncology [facility] [phone] (Office)

## 2013-07-13 ENCOUNTER — Ambulatory Visit (HOSPITAL_COMMUNITY)
Admission: RE | Admit: 2013-07-13 | Discharge: 2013-07-13 | Disposition: A | Discharge status: Home / Self Care | Payer: Medicare Other | Source: Ambulatory Visit | Attending: Hematology and Oncology | Admitting: Hematology and Oncology

## 2013-07-13 ENCOUNTER — Encounter (HOSPITAL_COMMUNITY): Payer: Self-pay

## 2013-07-13 DIAGNOSIS — C50419 Malignant neoplasm of upper-outer quadrant of unspecified female breast: Secondary | ICD-10-CM | POA: Insufficient documentation

## 2013-07-13 DIAGNOSIS — C50411 Malignant neoplasm of upper-outer quadrant of right female breast: Secondary | ICD-10-CM

## 2013-07-13 DIAGNOSIS — Z9010 Acquired absence of unspecified breast and nipple: Secondary | ICD-10-CM | POA: Insufficient documentation

## 2013-07-13 MED ORDER — IOHEXOL 300 MG/ML  SOLN
80.0000 mL | Freq: Once | INTRAMUSCULAR | Status: AC | PRN
  Administered 2013-07-13: 80 mL via INTRAVENOUS

## 2013-08-13 ENCOUNTER — Other Ambulatory Visit (INDEPENDENT_AMBULATORY_CARE_PROVIDER_SITE_OTHER): Payer: Self-pay

## 2013-08-13 MED ORDER — UNABLE TO FIND: Status: DC

## 2013-09-10 ENCOUNTER — Other Ambulatory Visit (INDEPENDENT_AMBULATORY_CARE_PROVIDER_SITE_OTHER): Payer: Self-pay | Admitting: General Surgery

## 2013-09-10 DIAGNOSIS — Z853 Personal history of malignant neoplasm of breast: Secondary | ICD-10-CM

## 2013-09-18 ENCOUNTER — Ambulatory Visit
Admission: RE | Admit: 2013-09-18 | Discharge: 2013-09-18 | Disposition: A | Discharge status: Home / Self Care | Payer: Medicare Other | Source: Ambulatory Visit | Attending: General Surgery | Admitting: General Surgery

## 2013-09-18 ENCOUNTER — Other Ambulatory Visit (INDEPENDENT_AMBULATORY_CARE_PROVIDER_SITE_OTHER): Payer: Self-pay | Admitting: General Surgery

## 2013-09-18 DIAGNOSIS — Z9011 Acquired absence of right breast and nipple: Secondary | ICD-10-CM

## 2013-09-18 DIAGNOSIS — Z853 Personal history of malignant neoplasm of breast: Secondary | ICD-10-CM

## 2013-10-10 ENCOUNTER — Other Ambulatory Visit: Payer: Self-pay | Admitting: *Deleted

## 2013-10-10 DIAGNOSIS — C50411 Malignant neoplasm of upper-outer quadrant of right female breast: Secondary | ICD-10-CM

## 2013-10-11 ENCOUNTER — Encounter: Payer: Self-pay | Admitting: Adult Health

## 2013-10-11 ENCOUNTER — Ambulatory Visit (HOSPITAL_BASED_OUTPATIENT_CLINIC_OR_DEPARTMENT_OTHER): Payer: Medicare Other | Admitting: Adult Health

## 2013-10-11 ENCOUNTER — Other Ambulatory Visit (HOSPITAL_BASED_OUTPATIENT_CLINIC_OR_DEPARTMENT_OTHER): Payer: Medicare Other

## 2013-10-11 VITALS — BP 107/51 | HR 86 | Temp 98.2°F | Resp 18 | Ht 65.0 in | Wt 121.8 lb

## 2013-10-11 DIAGNOSIS — C50419 Malignant neoplasm of upper-outer quadrant of unspecified female breast: Secondary | ICD-10-CM

## 2013-10-11 DIAGNOSIS — C50411 Malignant neoplasm of upper-outer quadrant of right female breast: Secondary | ICD-10-CM

## 2013-10-11 DIAGNOSIS — Z170 Estrogen receptor positive status [ER+]: Secondary | ICD-10-CM

## 2013-10-11 LAB — COMPREHENSIVE METABOLIC PANEL (CC13)
ALK PHOS: 62 U/L (ref 40–150)
ALT: 19 U/L (ref 0–55)
AST: 20 U/L (ref 5–34)
Albumin: 3.6 g/dL (ref 3.5–5.0)
Anion Gap: 6 mEq/L (ref 3–11)
BUN: 23.6 mg/dL (ref 7.0–26.0)
CALCIUM: 9.5 mg/dL (ref 8.4–10.4)
CHLORIDE: 102 meq/L (ref 98–109)
CO2: 30 mEq/L — ABNORMAL HIGH (ref 22–29)
CREATININE: 0.9 mg/dL (ref 0.6–1.1)
Glucose: 119 mg/dl (ref 70–140)
Potassium: 4.6 mEq/L (ref 3.5–5.1)
Sodium: 139 mEq/L (ref 136–145)
Total Bilirubin: 0.95 mg/dL (ref 0.20–1.20)
Total Protein: 6.2 g/dL — ABNORMAL LOW (ref 6.4–8.3)

## 2013-10-11 LAB — CBC WITH DIFFERENTIAL/PLATELET
BASO%: 0.4 % (ref 0.0–2.0)
BASOS ABS: 0 10*3/uL (ref 0.0–0.1)
EOS%: 1.8 % (ref 0.0–7.0)
Eosinophils Absolute: 0.1 10*3/uL (ref 0.0–0.5)
HEMATOCRIT: 38.7 % (ref 34.8–46.6)
HGB: 12.9 g/dL (ref 11.6–15.9)
LYMPH%: 24.6 % (ref 14.0–49.7)
MCH: 30.4 pg (ref 25.1–34.0)
MCHC: 33.3 g/dL (ref 31.5–36.0)
MCV: 91.1 fL (ref 79.5–101.0)
MONO#: 0.6 10*3/uL (ref 0.1–0.9)
MONO%: 7.9 % (ref 0.0–14.0)
NEUT#: 5.1 10*3/uL (ref 1.5–6.5)
NEUT%: 65.3 % (ref 38.4–76.8)
PLATELETS: 225 10*3/uL (ref 145–400)
RBC: 4.25 10*6/uL (ref 3.70–5.45)
RDW: 13 % (ref 11.2–14.5)
WBC: 7.9 10*3/uL (ref 3.9–10.3)
lymph#: 1.9 10*3/uL (ref 0.9–3.3)

## 2013-10-11 NOTE — Progress Notes (Signed)
OFFICE PROGRESS NOTE  CC**  Melody Casco, MD [address] [place] [address] Dr. Fanny Skates  Dr. Kyung Rudd  Chief complaint: Follow up visit for breast cancer  DIAGNOSIS: 80 year old female with right breast cancer STAGE:  Breast cancer of upper-outer quadrant of right female breast  Primary site: Breast (Right)  Staging method: AJCC 7th Edition  Clinical: Stage IIA (T2, N0, cM0)  Summary: Stage IIA (T2, N0, cM0)   PRIOR THERAPY: As per previously documented Dr. Laurelyn Sickle Note:  #1 patient Felt a lump in her right breast. Her last mammogram was about 2 years ago. Patient had a mammogram and right breast ultrasound performed on 09/07/2012. This revealed a 3.3 x 2.2 x 1.6 cm mass with calcifications in the upper outer quadrant of the right breast. Ultrasound was poorly defined and stated to be at 9  cm from the nipple and to be 2.2 cm. The right axilla looked normal by ultrasound. Patient had image guided biopsy performed that showed a receptor-positive, HER-2-negative invasive ductal carcinoma. MRI showed a solitary finding in the right breast measuring 3.3 cm in size extending all the way back to the pectoralis fascia. But it did not invade them muscle.  #2 she is now status post mastectomy performed on 10/31/2012 which revealed a grade 2 invasive ductal carcinoma measuring 3.5 cm. Tumor was ER positive PR positive HER-2/neu negative with a Ki-67 13% 3 sentinel nodes were negative for metastatic disease.  #3postmastectomy adjuvant antiestrogen therapy with tamoxifen 20 mg daily was recommended for a total of 10 years.  This was discontinued 02/21/2013 due to side effects  CURRENT THERAPY:  Discontinued tamoxifen in December 2014  INTERVAL HISTORY: Melody Tran 80 y.o. female returns for followup visit for breast cancer. She did stop taking Tamoxifen and also declined anti-estrogen therapy with aromatase inhibitors.  She is doing well.  She remains  active and denies any new pain, headaches, fevers, chills, bowel/bladder changes, or any further concerns.  She was previously seen by Dr. Earnest Conroy on 07/12/2013 and right axillary lymphadenopathy was noted.  A CT chest was ordered.  The patient would like to know the results of the CT scan.  Otherwise she is doing well and without questions and concerns.  We reviewed her health maintenance below.   MEDICAL HISTORY: Past Medical History  Diagnosis Date  . Hypertension   . Anxiety   . GERD (gastroesophageal reflux disease)   . Diabetes mellitus without complication   . Osteoporosis   . Arthritis     ALLERGIES:  is allergic to asa.  MEDICATIONS:  Current Outpatient Prescriptions  Medication Sig Dispense Refill  . ALPRAZolam (XANAX) 0.5 MG tablet Take 0.25 mg by mouth at bedtime.       . Artificial Tear Solution (TEARS NATURALE OP) Place 1 drop into both eyes 4 (four) times daily. For Rosacea of eyes      . Biotin 1000 MCG tablet Take 1,000 mcg by mouth daily.      . Blood Glucose Monitoring Suppl (ONE TOUCH ULTRA 2) W/DEVICE KIT       . Calcium Carb-Cholecalciferol (CALCIUM 500 +D) 500-400 MG-UNIT TABS Take 1 tablet by mouth 2 (two) times daily.      . cholecalciferol (VITAMIN D) 1000 UNITS tablet Take 1,000 Units by mouth daily.      . Coenzyme Q10 (CO Q-10) 100 MG CAPS Take by mouth.      . diltiazem 2 % GEL Apply 1 application topically 2 (  two) times daily.      . KRILL OIL PO Take 353 mg by mouth daily.      . lisinopril (PRINIVIL,ZESTRIL) 5 MG tablet Take 5 mg by mouth daily with supper.       . loratadine (CLARITIN) 10 MG tablet Take 10 mg by mouth daily. Alavert-Antihistamine      . Magnesium 250 MG TABS Take 250 mg by mouth daily.       . ONE TOUCH ULTRA TEST test strip       . ONETOUCH DELICA LANCETS FINE MISC       . UNABLE TO FIND 174.9 Malignant Neoplasm of Breast-Right  L8020- Mastectomy Form Replacement -1  1 each  0  . acetaminophen (TYLENOL) 325 MG tablet Take 325 mg by  mouth daily as needed for pain.        No current facility-administered medications for this visit.    SURGICAL HISTORY:  Past Surgical History  Procedure Laterality Date  . Breast surgery    . Eye surgery    . Cholecystectomy    . Appendectomy    . Tonsillectomy  1937  . Knee surgery  2005  . Tonsillectomy    . Thumb arthroscopy Left   . Mastectomy Right 10/31/12  . Mastectomy w/ sentinel node biopsy Right 10/31/2012    Procedure: Right total MASTECTOMY WITH right axillary SENTINEL LYMPH NODE BIOPSY;  Surgeon: Haywood M Ingram, MD;  Location: [facility];  Service: General;  Laterality: Right;    REVIEW OF SYSTEMS: A 10 point review of symptoms were assessed and pertinent symptoms were mentioned in interval history  Health Maintenance  Mammogram: 09/2013 Colonoscopy: 2010, repeat unnecessary Bone Density Scan: 03/2011, repeat needed Pap Smear: years ago Eye Exam: 04/2013 Vitamin D Level: checked and followed by Dr. Griffin Lipid Panel: 09/2013 Dr. Griffin  PHYSICAL EXAMINATION: Blood pressure 107/51, pulse 86, temperature 98.2 F (36.8 C), temperature source Oral, resp. rate 18, height 5' 5" (1.651 m), weight 121 lb 12.8 oz (55.248 kg). Body mass index is 20.27 kg/(m^2). GENERAL: Patient is a well appearing female in no acute distress HEENT:  Sclerae anicteric.  Oropharynx clear and moist. No ulcerations or evidence of oropharyngeal candidiasis. Neck is supple.  NODES:  No cervical, supraclavicular, or axillary lymphadenopathy palpated.  BREAST EXAM:  Deferred. LUNGS:  Clear to auscultation bilaterally.  No wheezes or rhonchi. HEART:  Regular rate and rhythm. No murmur appreciated. ABDOMEN:  Soft, nontender.  Positive, normoactive bowel sounds. No organomegaly palpated. MSK:  No focal spinal tenderness to palpation. Full range of motion bilaterally in the upper extremities. EXTREMITIES:  No peripheral edema.   SKIN:  Clear with no obvious rashes or skin changes. No nail  dyscrasia. NEURO:  Nonfocal. Well oriented.  Appropriate affect. ECOG PERFORMANCE STATUS: 0 - Asymptomatic   LABORATORY DATA: Lab Results  Component Value Date   WBC 7.9 10/11/2013   HGB 12.9 10/11/2013   HCT 38.7 10/11/2013   MCV 91.1 10/11/2013   PLT 225 10/11/2013      Chemistry      Component Value Date/Time   NA 139 10/11/2013 1050   NA 132* 10/23/2012 1426   K 4.6 10/11/2013 1050   K 4.1 10/23/2012 1426   CL 95* 10/23/2012 1426   CO2 30* 10/11/2013 1050   CO2 29 10/23/2012 1426   BUN 23.6 10/11/2013 1050   BUN 15 10/23/2012 1426   CREATININE 0.9 10/11/2013 1050   CREATININE 0.80 10/23/2012 1426        Component Value Date/Time   CALCIUM 9.5 10/11/2013 1050   CALCIUM 9.6 10/23/2012 1426   ALKPHOS 62 10/11/2013 1050   ALKPHOS 65 10/23/2012 1426   AST 20 10/11/2013 1050   AST 19 10/23/2012 1426   ALT 19 10/11/2013 1050   ALT 22 10/23/2012 1426   BILITOT 0.95 10/11/2013 1050   BILITOT 0.7 10/23/2012 1426     ADDITIONAL INFORMATION: 3. CHROMOGENIC IN-SITU HYBRIDIZATION Results: HER-2/NEU BY CISH - NO AMPLIFICATION OF HER-2 DETECTED. RESULT RATIO OF HER2: CEP 17 SIGNALS 1.27 AVERAGE HER2 COPY NUMBER PER CELL 1.65 REFERENCE RANGE NEGATIVE HER2/Chr17 Ratio <2.0 and Average HER2 copy number <4.0 EQUIVOCAL HER2/Chr17 Ratio <2.0 and Average HER2 copy number 4.0 and <6.0 POSITIVE HER2/Chr17 Ratio >=2.0 and/or Average HER2 copy number >=6.0 Enid Cutter MD Pathologist, Electronic Signature ( Signed 11/08/2012) FINAL DIAGNOSIS Diagnosis 1. Lymph node, sentinel, biopsy, right, axillary #1 - THERE IS NO EVIDENCE OF CARCINOMA IN 1 OF 1 LYMPH NODE (0/1). 2. Lymph node, sentinel, biopsy, right, axillary #2 - THERE IS NO EVIDENCE OF CARCINOMA IN 1 OF 1 LYMPH NODE (0/1). 3. Breast, simple mastectomy, right - INVASIVE DUCTAL CARCINOMA, GRADE II/III, SPANNING 3.5 CM. - LYMPHOVASCULAR INVASION IS IDENTIFIED. - DUCTAL CARCINOMA IN SITU WITH CALCIFICATIONS, LOW GRADE. - THE SURGICAL RESECTION MARGINS ARE NEGATIVE  FOR CARCINOMA. - SEE ONCOLOGY TABLE BELOW. 1 of 3 FINAL for Reynolds, Rei E ([id number]) Microscopic Comment 3. BREAST, INVASIVE TUMOR, WITH LYMPH NODE SAMPLING Specimen, including laterality: Right breast. Procedure: Simple mastectomy. Grade: II Tubule formation: 2 Nuclear pleomorphism: 2 Mitotic:2 Tumor size (gross measurement): 3.5 cm Margins: Negative for carcinoma. Invasive, distance to closest margin: 0.3 cm to the posterior margin (gross measurement). In-situ, distance to closest margin: Greater than 0.3 cm to all margins (gross measurement) Lymphovascular invasion: Present. Ductal carcinoma in situ: Present. Grade: Low grade. Extensive intraductal component: No. Lobular neoplasia: Not identified. Tumor focality: Unifocal Treatment effect: N/A Extent of tumor: Confined to breast parenchyma. Lymph nodes: # examined: 2 Lymph nodes with metastasis: 0 Breast prognostic profile: [id number] Estrogen receptor: 100%, strong staining intensity. Progesterone receptor: 98%, strong staining intensity Her 2 neu: No amplification was detected. The ratio was 0.81. Her2 Will be repeated on the current case and the results reported separated. Ki-67: 13% Non-neoplastic breast: Fibrocystic changes. TNM: pT2, pN0. (JBK:gt, 11/01/12) Enid Cutter MD Pathologist, Electronic Signature (Case signed 11/01/2012) Intraoperative Diag  RADIOGRAPHIC STUDIES:  Chest 2 View  10/31/2012   CLINICAL DATA:  Invasive right breast carcinoma. Pre-op respiratory exam.  EXAM: CHEST  2 VIEW  COMPARISON:  03/21/2012  FINDINGS: Heart size is normal. Pulmonary hyperinflation again noted, however both lungs are clear. No evidence of pleural effusion. No mass or lymphadenopathy identified. Mild thoracic dextroscoliosis remains stable.  IMPRESSION: Stable exam. No active disease.   Electronically Signed   By: Earle Gell   On: 10/31/2012 07:39   Nm Sentinel Node Inj-no Rpt (breast)  10/31/2012   CLINICAL  DATA: Cancer right breast   Sulfur colloid was injected intradermally by the nuclear medicine  technologist for breast cancer sentinel node localization.     ASSESSMENT: 80 year old female with  #1 stage II (T2 N0) invasive ductal carcinoma status post simple right mastectomy with sentinel lymph node biopsy. All sentinel nodes were negative. Tumor was ER positive PR positive HER-2/neu negative with a low Ki-67. Patient did not receive radiation therapy because of mastectomy. She was begun on antiestrogen therapy with tamoxifen 20 mg on a daily basis, she took Tamoxifen for about  3 months and then stopped due to adverse effects.  She declined aromatase inhibitors when offered.   PLAN:  Melody Tran is doing well today.  Her labs are stable and I reviewed this with her in detail.  We reviewed her health maintenance above.  I recommended she have a bone density repeated which for now she will talk to her PCP out.  We reviewed the CT scan results above.  She is pleased that there is no lymphadenopathy visualized, and I palpated none on exam today.  We discussed survivorship including healthy diet, exercise, and monthly breast exams.    Melody Tran will return in 6 months for evaluation.    All questions were answered. The patient knows to call the clinic with any problems, questions or concerns. We can certainly see the patient much sooner if necessary.  I spent 25 minutes counseling the patient face to face. The total time spent in the appointment was 30 minutes.    N. Cornetto, NP Medical Oncology [facility] [phone]        

## 2013-10-17 ENCOUNTER — Telehealth: Payer: Self-pay | Admitting: Adult Health

## 2013-10-17 NOTE — Telephone Encounter (Signed)
, °

## 2013-10-25 ENCOUNTER — Encounter (INDEPENDENT_AMBULATORY_CARE_PROVIDER_SITE_OTHER): Payer: Self-pay | Admitting: General Surgery

## 2013-10-25 ENCOUNTER — Ambulatory Visit (INDEPENDENT_AMBULATORY_CARE_PROVIDER_SITE_OTHER): Payer: Medicare Other | Admitting: General Surgery

## 2013-10-25 VITALS — BP 110/70 | HR 80 | Temp 97.3°F | Resp 16 | Ht 66.0 in | Wt 120.2 lb

## 2013-10-25 DIAGNOSIS — C50419 Malignant neoplasm of upper-outer quadrant of unspecified female breast: Secondary | ICD-10-CM

## 2013-10-25 DIAGNOSIS — C50411 Malignant neoplasm of upper-outer quadrant of right female breast: Secondary | ICD-10-CM

## 2013-10-25 NOTE — Progress Notes (Signed)
Patient ID: Melody Tran, female   DOB: 05-16-1928, 80 y.o.   MRN: [id number]  History:  This patient comes in for LTFU.Marland Kitchen  She underwent right mastectomy and sentinel node biopsy on 10/31/2012.  Final pathology shows invasive ductal carcinoma and associated DCIS, 3.5 cm diameter, receptor positive, HER-2-negative, stage T2, N0. Stage II A.  She has seen Dr. Marcy Panning, and was started on tamoxifen, But that has been stopped because of increasing osteoporosis symptoms and joint pain. She feels better now.  . She has no wound problems.  Mammograms of the left breast performed on September 18, 2013 look good. , benign findings, category 2.  Past history, family history, social history, and review of systems are documented on the chart, unchanged, and noncontributory except as described above.   Exam:  Patient is alert. No distress. Good spirits.  Right mastectomy skin flaps looked good.. Right axilla feels normal. Range of motion right shoulder is normal. No arm swelling.  Left breast is somewhat atrophic. Soft. No skin change breast mass or axillary adenopathy. Lungs clear to auscultation bilaterally  Heart regular rate and rhythm. No murmur. No ectopy  Neck no adenopathy or mass.   Assessment:  Invasive ductal carcinoma right breast, 3:00 position, 3.5 cm in diameter, receptor positive, HER-2-negative. Pathologic stage T2, N0. Stage IIA.  No evidence of recurrence 6 months postop right mastectomy and sentinel node biopsy   Plan:  Diet, activity, and wound care discussed  Appointment with Dr. Lindi Adie in February Repeat mammograms in 1 year Return to see me in one year.      Edsel Petrin. Dalbert Batman, M.D., [facility]  General and Minimally invasive Surgery  Breast and Colorectal Surgery  Office: [phone]  Pager: [phone]

## 2013-10-25 NOTE — Patient Instructions (Signed)
Examination of your right mastectomy wound, left breast, and all the regional lymph nodes is normal. Mammogram of her left breast is normal. There is no evidence of cancer.  Keep your appointment with Dr. Lindi Adie, your new oncologist  Repeat left breast mammogram in 1 year  Return to see Dr. Dalbert Batman in one year.

## 2014-01-16 ENCOUNTER — Encounter: Payer: Self-pay | Admitting: *Deleted

## 2014-01-20 ENCOUNTER — Encounter: Payer: Self-pay | Admitting: *Deleted

## 2014-02-09 ENCOUNTER — Telehealth: Payer: Self-pay | Admitting: Hematology and Oncology

## 2014-02-09 NOTE — Telephone Encounter (Signed)
s.w. pt and advised on 2.10 time change....pt ok and aware

## 2014-04-17 ENCOUNTER — Ambulatory Visit (HOSPITAL_BASED_OUTPATIENT_CLINIC_OR_DEPARTMENT_OTHER): Payer: Medicare Other | Admitting: Hematology and Oncology

## 2014-04-17 ENCOUNTER — Telehealth: Payer: Self-pay | Admitting: Hematology and Oncology

## 2014-04-17 ENCOUNTER — Ambulatory Visit: Payer: Medicare Other | Admitting: Hematology and Oncology

## 2014-04-17 VITALS — BP 129/60 | HR 92 | Temp 97.2°F | Resp 18 | Ht 66.0 in | Wt 120.8 lb

## 2014-04-17 DIAGNOSIS — C50411 Malignant neoplasm of upper-outer quadrant of right female breast: Secondary | ICD-10-CM

## 2014-04-17 DIAGNOSIS — Z853 Personal history of malignant neoplasm of breast: Secondary | ICD-10-CM

## 2014-04-17 NOTE — Progress Notes (Signed)
Patient Care Team: Kelton Pillar, MD as PCP - General (Family Medicine)  DIAGNOSIS: Breast cancer of upper-outer quadrant of right female breast   Staging form: Breast, AJCC 7th Edition     Clinical: Stage IIA (T2, N0, cM0) - Unsigned       Staging comments: Staged at breast conference 09/27/12      Pathologic: No stage assigned - Unsigned   SUMMARY OF ONCOLOGIC HISTORY:   Breast cancer of upper-outer quadrant of right female breast   10/31/2012 Surgery Right breast mastectomy: Grade 2 invasive ductal carcinoma measuring 3.5 cm ER/PR positive HER-2 negative Ki-67 13%, 3 sentinel nodes negative T2 N0 M0 stage II a   11/15/2012 - 02/21/2013 Anti-estrogen oral therapy Tamoxifen 20 mg daily discontinued due to side effects    CHIEF COMPLIANT: Breast cancer follow-up  INTERVAL HISTORY: Melody Tran is a 81 year old lady with above-mentioned history of right-sided breast cancer treated with mastectomy and could not tolerate tamoxifen. She is on surveillance and observation. She's been doing fairly well without any problems or concerns. Her husband who has sideroblastic anemia is in hospice care.  REVIEW OF SYSTEMS:   Constitutional: Denies fevers, chills or abnormal weight loss Eyes: Denies blurriness of vision Ears, nose, mouth, throat, and face: Denies mucositis or sore throat Respiratory: Denies cough, dyspnea or wheezes Cardiovascular: Denies palpitation, chest discomfort or lower extremity swelling Gastrointestinal:  Denies nausea, heartburn or change in bowel habits Skin: Denies abnormal skin rashes Lymphatics: Denies new lymphadenopathy or easy bruising Neurological:Denies numbness, tingling or new weaknesses Behavioral/Psych: Mood is stable, no new changes  Breast:  denies any pain or lumps or nodules in either breasts All other systems were reviewed with the patient and are negative.  I have reviewed the past medical history, past surgical history, social history and family  history with the patient and they are unchanged from previous note.  ALLERGIES:  is allergic to asa.  MEDICATIONS:  Current Outpatient Prescriptions  Medication Sig Dispense Refill  . acetaminophen (TYLENOL) 325 MG tablet Take 325 mg by mouth daily as needed for pain.     Marland Kitchen ALPRAZolam (XANAX) 0.5 MG tablet Take 0.25 mg by mouth at bedtime.     . Artificial Tear Solution (TEARS NATURALE OP) Place 1 drop into both eyes 4 (four) times daily. For Rosacea of eyes    . Biotin 1000 MCG tablet Take 1,000 mcg by mouth daily.    . Blood Glucose Monitoring Suppl (ONE TOUCH ULTRA 2) W/DEVICE KIT     . Calcium Carb-Cholecalciferol (CALCIUM 500 +D) 500-400 MG-UNIT TABS Take 1 tablet by mouth 2 (two) times daily.    . cholecalciferol (VITAMIN D) 1000 UNITS tablet Take 1,000 Units by mouth daily.    . Coenzyme Q10 (CO Q-10) 100 MG CAPS Take by mouth.    Marland Kitchen KRILL OIL PO Take 353 mg by mouth daily.    Marland Kitchen lisinopril (PRINIVIL,ZESTRIL) 5 MG tablet Take 5 mg by mouth daily with supper.     . loratadine (CLARITIN) 10 MG tablet Take 10 mg by mouth daily. Alavert-Antihistamine    . LOTEMAX 0.5 % ophthalmic suspension   0  . Magnesium 250 MG TABS Take 250 mg by mouth daily.     . ONE TOUCH ULTRA TEST test strip     . ONETOUCH DELICA LANCETS FINE MISC     . UNABLE TO FIND 174.9 Malignant Neoplasm of Breast-Right  L8020- Mastectomy Form Replacement -1 1 each 0   No current facility-administered medications for  this visit.    PHYSICAL EXAMINATION: ECOG PERFORMANCE STATUS: 0 - Asymptomatic  Filed Vitals:   04/17/14 1323  BP: 129/60  Pulse: 92  Temp: 97.2 F (36.2 C)  Resp: 18   Filed Weights   04/17/14 1323  Weight: 120 lb 12.8 oz (54.795 kg)    GENERAL:alert, no distress and comfortable SKIN: skin color, texture, turgor are normal, no rashes or significant lesions EYES: normal, Conjunctiva are pink and non-injected, sclera clear OROPHARYNX:no exudate, no erythema and lips, buccal mucosa, and tongue  normal  NECK: supple, thyroid normal size, non-tender, without nodularity LYMPH:  no palpable lymphadenopathy in the cervical, axillary or inguinal LUNGS: clear to auscultation and percussion with normal breathing effort HEART: regular rate & rhythm and no murmurs and no lower extremity edema ABDOMEN:abdomen soft, non-tender and normal bowel sounds Musculoskeletal:no cyanosis of digits and no clubbing  NEURO: alert & oriented x 3 with fluent speech, no focal motor/sensory deficits BREAST: No palpable masses or nodules in left breast. No palpable axillary supraclavicular or infraclavicular adenopathy no breast tenderness or nipple discharge. (exam performed in the presence of a chaperone)  LABORATORY DATA:  I have reviewed the data as listed   Chemistry      Component Value Date/Time   NA 139 10/11/2013 1050   NA 132* 10/23/2012 1426   K 4.6 10/11/2013 1050   K 4.1 10/23/2012 1426   CL 95* 10/23/2012 1426   CO2 30* 10/11/2013 1050   CO2 29 10/23/2012 1426   BUN 23.6 10/11/2013 1050   BUN 15 10/23/2012 1426   CREATININE 0.9 10/11/2013 1050   CREATININE 0.80 10/23/2012 1426      Component Value Date/Time   CALCIUM 9.5 10/11/2013 1050   CALCIUM 9.6 10/23/2012 1426   ALKPHOS 62 10/11/2013 1050   ALKPHOS 65 10/23/2012 1426   AST 20 10/11/2013 1050   AST 19 10/23/2012 1426   ALT 19 10/11/2013 1050   ALT 22 10/23/2012 1426   BILITOT 0.95 10/11/2013 1050   BILITOT 0.7 10/23/2012 1426       Lab Results  Component Value Date   WBC 7.9 10/11/2013   HGB 12.9 10/11/2013   HCT 38.7 10/11/2013   MCV 91.1 10/11/2013   PLT 225 10/11/2013   NEUTROABS 5.1 10/11/2013     RADIOGRAPHIC STUDIES: I have personally reviewed the radiology reports and agreed with their findings. Mammogram 09/18/2013 is normal  ASSESSMENT & PLAN:  Breast cancer of upper-outer quadrant of right female breast Right breast invasive ductal carcinoma T2 N0 M0 stage II A, 3.5 cemented tumor ER/PR positive  HER-2 negative yes to 713% status post mastectomy could not tolerate antiestrogen therapy with tamoxifen. Currently on surveillance.  Breast cancer surveillance: 1. Breast exam 04/17/2014 is normal 2. Mammogram left breast 09/18/2013 was normal  Return to clinic once a year for follow-up  No orders of the defined types were placed in this encounter.   The patient has a good understanding of the overall plan. she agrees with it. She will call with any problems that may develop before her next visit here.   Rulon Eisenmenger, MD

## 2014-04-17 NOTE — Assessment & Plan Note (Signed)
Right breast invasive ductal carcinoma T2 N0 M0 stage II A, 3.5 cemented tumor ER/PR positive HER-2 negative yes to 713% status post mastectomy could not tolerate antiestrogen therapy with tamoxifen. Currently on surveillance.  Breast cancer surveillance: 1. Breast exam 04/17/2014 is normal 2. Mammogram left breast 09/18/2013 was normal  Return to clinic once a year for follow-up 

## 2014-04-17 NOTE — Telephone Encounter (Signed)
gave pt avs report and appts for feb 2017

## 2014-09-02 ENCOUNTER — Other Ambulatory Visit: Payer: Self-pay

## 2014-09-11 ENCOUNTER — Other Ambulatory Visit: Payer: Self-pay

## 2014-09-11 DIAGNOSIS — Z9011 Acquired absence of right breast and nipple: Secondary | ICD-10-CM

## 2014-09-11 DIAGNOSIS — Z1231 Encounter for screening mammogram for malignant neoplasm of breast: Secondary | ICD-10-CM

## 2014-10-16 ENCOUNTER — Ambulatory Visit
Admission: RE | Admit: 2014-10-16 | Discharge: 2014-10-16 | Disposition: A | Discharge status: Home / Self Care | Payer: Medicare Other | Source: Ambulatory Visit

## 2014-10-16 DIAGNOSIS — Z9011 Acquired absence of right breast and nipple: Secondary | ICD-10-CM

## 2014-10-16 DIAGNOSIS — Z1231 Encounter for screening mammogram for malignant neoplasm of breast: Secondary | ICD-10-CM

## 2015-03-24 ENCOUNTER — Telehealth: Payer: Self-pay | Admitting: Hematology and Oncology

## 2015-03-24 NOTE — Telephone Encounter (Signed)
Spoke with patient about appointment change 2/9 to 2/16- patient aware. Appointment sent via mail.

## 2015-04-17 ENCOUNTER — Ambulatory Visit: Payer: Medicare Other | Admitting: Hematology and Oncology

## 2015-04-23 NOTE — Assessment & Plan Note (Signed)
Right breast invasive ductal carcinoma T2 N0 M0 stage II A, 3.5 cemented tumor ER/PR positive HER-2 negative yes to 713% status post mastectomy could not tolerate antiestrogen therapy with tamoxifen. Currently on surveillance.  Breast cancer surveillance: 1. Breast exam 04/24/2015 is normal 2. Mammogram left breast 10/16/14 was normal, density cat B  Return to clinic once a year for follow-up with survivorship clinic

## 2015-04-24 ENCOUNTER — Telehealth: Payer: Self-pay | Admitting: Hematology and Oncology

## 2015-04-24 ENCOUNTER — Ambulatory Visit (HOSPITAL_BASED_OUTPATIENT_CLINIC_OR_DEPARTMENT_OTHER): Payer: Medicare Other | Admitting: Hematology and Oncology

## 2015-04-24 ENCOUNTER — Encounter: Payer: Self-pay | Admitting: Hematology and Oncology

## 2015-04-24 VITALS — BP 148/52 | HR 97 | Temp 97.9°F | Resp 17 | Wt 121.4 lb

## 2015-04-24 DIAGNOSIS — Z853 Personal history of malignant neoplasm of breast: Secondary | ICD-10-CM

## 2015-04-24 DIAGNOSIS — C50411 Malignant neoplasm of upper-outer quadrant of right female breast: Secondary | ICD-10-CM

## 2015-04-24 NOTE — Telephone Encounter (Signed)
Appointment made and avs printed °

## 2015-04-24 NOTE — Progress Notes (Signed)
Patient Care Team: Kelton Pillar, MD as PCP - General (Family Medicine)  DIAGNOSIS: Breast cancer of upper-outer quadrant of right female breast [facility])   Staging form: Breast, AJCC 7th Edition     Clinical: Stage IIA (T2, N0, cM0) - Unsigned       Staging comments: Staged at breast conference 09/27/12      Pathologic: No stage assigned - Unsigned   SUMMARY OF ONCOLOGIC HISTORY:   Breast cancer of upper-outer quadrant of right female breast ([place])   10/31/2012 Surgery Right breast mastectomy: Grade 2 invasive ductal carcinoma measuring 3.5 cm ER/PR positive HER-2 negative Ki-67 13%, 3 sentinel nodes negative T2 N0 M0 stage II a   11/15/2012 - 02/21/2013 Anti-estrogen oral therapy Tamoxifen 20 mg daily discontinued due to side effects    CHIEF COMPLIANT: Follow-up of right breast cancer  INTERVAL HISTORY: Melody Tran is a 82 year old with above-mentioned history of right breast cancer treated with mastectomy but could not tolerate antiestrogen therapy and it was discontinued after 3 months in 2014. She is here for annual follow-up. Her husband passed away last week. She is still recovering from that although she believes that he is in a better place. He was 80 years old. She denies any lumps or nodules in the breast.   REVIEW OF SYSTEMS:   Constitutional: Denies fevers, chills or abnormal weight loss Eyes: Denies blurriness of vision Ears, nose, mouth, throat, and face: Denies mucositis or sore throat Respiratory: Denies cough, dyspnea or wheezes Cardiovascular: Denies palpitation, chest discomfort Gastrointestinal:  Denies nausea, heartburn or change in bowel habits Skin: Denies abnormal skin rashes Lymphatics: Denies new lymphadenopathy or easy bruising Neurological:Denies numbness, tingling or new weaknesses Behavioral/Psych: Mood is stable, no new changes  Extremities: No lower extremity edema Breast:  denies any pain or lumps or nodules in either breasts All other systems  were reviewed with the patient and are negative.  I have reviewed the past medical history, past surgical history, social history and family history with the patient and they are unchanged from previous note.  ALLERGIES:  is allergic to asa.  MEDICATIONS:  Current Outpatient Prescriptions  Medication Sig Dispense Refill  . acetaminophen (TYLENOL) 325 MG tablet Take 325 mg by mouth daily as needed for pain.     Marland Kitchen ALPRAZolam (XANAX) 0.5 MG tablet Take 0.25 mg by mouth at bedtime.     . Artificial Tear Solution (TEARS NATURALE OP) Place 1 drop into both eyes 4 (four) times daily. For Rosacea of eyes    . Biotin 1000 MCG tablet Take 1,000 mcg by mouth daily.    . Blood Glucose Monitoring Suppl (ONE TOUCH ULTRA 2) W/DEVICE KIT     . Calcium Carb-Cholecalciferol (CALCIUM 500 +D) 500-400 MG-UNIT TABS Take 1 tablet by mouth 2 (two) times daily.    . cholecalciferol (VITAMIN D) 1000 UNITS tablet Take 1,000 Units by mouth daily.    . Coenzyme Q10 (CO Q-10) 100 MG CAPS Take by mouth.    Marland Kitchen KRILL OIL PO Take 353 mg by mouth daily.    Marland Kitchen lisinopril (PRINIVIL,ZESTRIL) 5 MG tablet Take 5 mg by mouth daily with supper.     . loratadine (CLARITIN) 10 MG tablet Take 10 mg by mouth daily. Alavert-Antihistamine    . LOTEMAX 0.5 % ophthalmic suspension   0  . Magnesium 250 MG TABS Take 250 mg by mouth daily.     . ONE TOUCH ULTRA TEST test strip     . [facility] DELICA  LANCETS FINE MISC     . ranitidine (ZANTAC) 75 MG tablet Take 75 mg by mouth 2 (two) times daily.    Marland Kitchen UNABLE TO FIND 174.9 Malignant Neoplasm of Breast-Right  L8020- Mastectomy Form Replacement -1 1 each 0   No current facility-administered medications for this visit.    PHYSICAL EXAMINATION: ECOG PERFORMANCE STATUS: 0 - Asymptomatic  Filed Vitals:   04/24/15 1320  BP: 148/52  Pulse: 97  Temp: 97.9 F (36.6 C)  Resp: 17   Filed Weights   04/24/15 1320  Weight: 121 lb 6.4 oz (55.067 kg)    GENERAL:alert, no distress and  comfortable SKIN: skin color, texture, turgor are normal, no rashes or significant lesions EYES: normal, Conjunctiva are pink and non-injected, sclera clear OROPHARYNX:no exudate, no erythema and lips, buccal mucosa, and tongue normal  NECK: supple, thyroid normal size, non-tender, without nodularity LYMPH:  no palpable lymphadenopathy in the cervical, axillary or inguinal LUNGS: clear to auscultation and percussion with normal breathing effort HEART: regular rate & rhythm and no murmurs and no lower extremity edema ABDOMEN:abdomen soft, non-tender and normal bowel sounds MUSCULOSKELETAL:no cyanosis of digits and no clubbing  NEURO: alert & oriented x 3 with fluent speech, no focal motor/sensory deficits EXTREMITIES: No lower extremity edema BREAST: No palpable lumps or nodules in the left breast. Right mastectomy scar appears to be intact. No palpable lymphadenopathy. (exam performed in the presence of a chaperone)  LABORATORY DATA:  I have reviewed the data as listed   Chemistry      Component Value Date/Time   NA 139 10/11/2013 1050   NA 132* 10/23/2012 1426   K 4.6 10/11/2013 1050   K 4.1 10/23/2012 1426   CL 95* 10/23/2012 1426   CO2 30* 10/11/2013 1050   CO2 29 10/23/2012 1426   BUN 23.6 10/11/2013 1050   BUN 15 10/23/2012 1426   CREATININE 0.9 10/11/2013 1050   CREATININE 0.80 10/23/2012 1426      Component Value Date/Time   CALCIUM 9.5 10/11/2013 1050   CALCIUM 9.6 10/23/2012 1426   ALKPHOS 62 10/11/2013 1050   ALKPHOS 65 10/23/2012 1426   AST 20 10/11/2013 1050   AST 19 10/23/2012 1426   ALT 19 10/11/2013 1050   ALT 22 10/23/2012 1426   BILITOT 0.95 10/11/2013 1050   BILITOT 0.7 10/23/2012 1426      Lab Results  Component Value Date   WBC 7.9 10/11/2013   HGB 12.9 10/11/2013   HCT 38.7 10/11/2013   MCV 91.1 10/11/2013   PLT 225 10/11/2013   NEUTROABS 5.1 10/11/2013   ASSESSMENT & PLAN:  Breast cancer of upper-outer quadrant of right female breast Right  breast invasive ductal carcinoma T2 N0 M0 stage II A, 3.5 cm tumor ER/PR positive HER-2 negative, Ki 67: 13% status post mastectomy 10/28/12; could not tolerate antiestrogen therapy with tamoxifen. Currently on surveillance.  Breast cancer surveillance: 1. Breast exam 04/24/2015 is normal 2. Mammogram left breast 10/16/14 was normal, density cat B Her husband passed away last week. They have been married for 64 years. Celebration of life is happening this weekend.  Return to clinic once a year for follow-up   No orders of the defined types were placed in this encounter.   The patient has a good understanding of the overall plan. she agrees with it. she will call with any problems that may develop before the next visit here.   Rulon Eisenmenger, MD 04/24/2015

## 2015-10-07 ENCOUNTER — Other Ambulatory Visit: Payer: Self-pay | Admitting: Family Medicine

## 2015-10-07 DIAGNOSIS — Z1231 Encounter for screening mammogram for malignant neoplasm of breast: Secondary | ICD-10-CM

## 2015-10-17 ENCOUNTER — Ambulatory Visit
Admission: RE | Admit: 2015-10-17 | Discharge: 2015-10-17 | Disposition: A | Discharge status: Home / Self Care | Payer: Medicare Other | Source: Ambulatory Visit | Attending: Family Medicine | Admitting: Family Medicine

## 2015-10-17 DIAGNOSIS — Z1231 Encounter for screening mammogram for malignant neoplasm of breast: Secondary | ICD-10-CM

## 2016-03-23 ENCOUNTER — Telehealth: Payer: Self-pay | Admitting: Hematology and Oncology

## 2016-03-23 NOTE — Telephone Encounter (Signed)
Faxed records to [facility] risk adjustment °

## 2016-04-19 ENCOUNTER — Encounter: Payer: Self-pay | Admitting: Hematology and Oncology

## 2016-04-19 ENCOUNTER — Ambulatory Visit (HOSPITAL_BASED_OUTPATIENT_CLINIC_OR_DEPARTMENT_OTHER): Payer: Self-pay | Admitting: Hematology and Oncology

## 2016-04-19 DIAGNOSIS — Z170 Estrogen receptor positive status [ER+]: Principal | ICD-10-CM

## 2016-04-19 DIAGNOSIS — C50411 Malignant neoplasm of upper-outer quadrant of right female breast: Secondary | ICD-10-CM

## 2016-04-19 DIAGNOSIS — Z853 Personal history of malignant neoplasm of breast: Secondary | ICD-10-CM

## 2016-04-19 NOTE — Assessment & Plan Note (Signed)
Right breast invasive ductal carcinoma T2 N0 M0 stage II A, 3.5 cm tumor ER/PR positive HER-2 negative, Ki 67: 13% status post mastectomy 10/28/12; could not tolerate antiestrogen therapy with tamoxifen. Currently on surveillance.  Breast cancer surveillance: 1. Breast exam 04/20/2015 is normal 2. Mammogram left breast 10/17/15 was normal, density cat B  Her husband passed away last year. They had been married for 64 years.   Return to clinic once a year for follow-up

## 2016-04-19 NOTE — Progress Notes (Signed)
Patient Care Team: Kelton Pillar, MD as PCP - General (Family Medicine)  DIAGNOSIS:  Encounter Diagnosis  Name Primary?  . Malignant neoplasm of upper-outer quadrant of right breast in female, estrogen receptor positive ([place])     SUMMARY OF ONCOLOGIC HISTORY:   Breast cancer of upper-outer quadrant of right female breast ([place])   10/31/2012 Surgery    Right breast mastectomy: Grade 2 invasive ductal carcinoma measuring 3.5 cm ER/PR positive HER-2 negative Ki-67 13%, 3 sentinel nodes negative T2 N0 M0 stage II a      11/15/2012 - 02/21/2013 Anti-estrogen oral therapy    Tamoxifen 20 mg daily discontinued due to side effects       CHIEF COMPLIANT: Surveillance of breast cancer  INTERVAL HISTORY: Melody Tran is a 83 year old with above-mentioned history right breast cancer on right mastectomy and is currently on surveillance. She could not tolerate tamoxifen and was discontinued. She denies any new problems or lumps or nodules in the breasts.  REVIEW OF SYSTEMS:   Constitutional: Denies fevers, chills or abnormal weight loss Eyes: Denies blurriness of vision Ears, nose, mouth, throat, and face: Denies mucositis or sore throat Respiratory: Denies cough, dyspnea or wheezes Cardiovascular: Denies palpitation, chest discomfort Gastrointestinal:  Denies nausea, heartburn or change in bowel habits Skin: Denies abnormal skin rashes Lymphatics: Denies new lymphadenopathy or easy bruising Neurological:Denies numbness, tingling or new weaknesses Behavioral/Psych: Mood is stable, no new changes  Extremities: No lower extremity edema Breast:  denies any pain or lumps or nodules in either breasts All other systems were reviewed with the patient and are negative.  I have reviewed the past medical history, past surgical history, social history and family history with the patient and they are unchanged from previous note.  ALLERGIES:  is allergic to asa [aspirin].  MEDICATIONS:    Current Outpatient Prescriptions  Medication Sig Dispense Refill  . acetaminophen (TYLENOL) 325 MG tablet Take 325 mg by mouth daily as needed for pain.     Marland Kitchen ALPRAZolam (XANAX) 0.5 MG tablet Take 0.25 mg by mouth at bedtime.     . Artificial Tear Solution (TEARS NATURALE OP) Place 1 drop into both eyes 4 (four) times daily. For Rosacea of eyes    . Biotin 1000 MCG tablet Take 1,000 mcg by mouth daily.    . Blood Glucose Monitoring Suppl (ONE TOUCH ULTRA 2) W/DEVICE KIT     . Calcium Carb-Cholecalciferol (CALCIUM 500 +D) 500-400 MG-UNIT TABS Take 1 tablet by mouth 2 (two) times daily.    . Chlorphen-PE-Acetaminophen (CORICIDIN D COLD/FLU/SINUS PO) Take 325 mg by mouth as needed. 325-70m    . cholecalciferol (VITAMIN D) 1000 UNITS tablet Take 1,000 Units by mouth daily.    . Coenzyme Q10 (CO Q-10) 100 MG CAPS Take by mouth.    .Marland KitchenKRILL OIL PO Take 353 mg by mouth daily.    .Marland Kitchenlisinopril (PRINIVIL,ZESTRIL) 5 MG tablet Take 5 mg by mouth daily with supper.     . loratadine (CLARITIN) 10 MG tablet Take 10 mg by mouth daily. Alavert-Antihistamine    . Magnesium 250 MG TABS Take 250 mg by mouth daily.     . ONE TOUCH ULTRA TEST test strip     . ONETOUCH DELICA LANCETS FINE MISC     . ranitidine (ZANTAC) 75 MG tablet Take 75 mg by mouth 2 (two) times daily.    .Marland KitchenUNABLE TO FIND 174.9 Malignant Neoplasm of Breast-Right  L8020- Mastectomy Form Replacement -1 1 each 0  No current facility-administered medications for this visit.     PHYSICAL EXAMINATION: ECOG PERFORMANCE STATUS: 1 - Symptomatic but completely ambulatory  Vitals:   04/19/16 1403  BP: (!) 117/54  Pulse: 91  Resp: 18  Temp: 98.2 F (36.8 C)   Filed Weights   04/19/16 1403  Weight: 122 lb 12.8 oz (55.7 kg)    GENERAL:alert, no distress and comfortable SKIN: skin color, texture, turgor are normal, no rashes or significant lesions EYES: normal, Conjunctiva are pink and non-injected, sclera clear OROPHARYNX:no exudate, no  erythema and lips, buccal mucosa, and tongue normal  NECK: supple, thyroid normal size, non-tender, without nodularity LYMPH:  no palpable lymphadenopathy in the cervical, axillary or inguinal LUNGS: clear to auscultation and percussion with normal breathing effort HEART: regular rate & rhythm and no murmurs and no lower extremity edema ABDOMEN:abdomen soft, non-tender and normal bowel sounds MUSCULOSKELETAL:no cyanosis of digits and no clubbing  NEURO: alert & oriented x 3 with fluent speech, no focal motor/sensory deficits EXTREMITIES: No lower extremity edema BREAST: No palpable masses or nodules. right mastectomy. No palpable axillary supraclavicular or infraclavicular adenopathy no breast tenderness or nipple discharge. (exam performed in the presence of a chaperone)  LABORATORY DATA:  I have reviewed the data as listed   Chemistry      Component Value Date/Time   NA 139 10/11/2013 1050   K 4.6 10/11/2013 1050   CL 95 (L) 10/23/2012 1426   CO2 30 (H) 10/11/2013 1050   BUN 23.6 10/11/2013 1050   CREATININE 0.9 10/11/2013 1050      Component Value Date/Time   CALCIUM 9.5 10/11/2013 1050   ALKPHOS 62 10/11/2013 1050   AST 20 10/11/2013 1050   ALT 19 10/11/2013 1050   BILITOT 0.95 10/11/2013 1050       Lab Results  Component Value Date   WBC 7.9 10/11/2013   HGB 12.9 10/11/2013   HCT 38.7 10/11/2013   MCV 91.1 10/11/2013   PLT 225 10/11/2013   NEUTROABS 5.1 10/11/2013    ASSESSMENT & PLAN:  Breast cancer of upper-outer quadrant of right female breast Right breast invasive ductal carcinoma T2 N0 M0 stage II A, 3.5 cm tumor ER/PR positive HER-2 negative, Ki 67: 13% status post mastectomy 10/28/12; could not tolerate antiestrogen therapy with tamoxifen. Currently on surveillance.  Breast cancer surveillance: 1. Breast exam 04/20/2015 is normal 2. Mammogram left breast 10/17/15 was normal, density cat B  Her husband passed away last year. They had been married for 64  years.   Return to clinic once a year for follow-up  I spent 15 minutes talking to the patient of which more than half was spent in counseling and coordination of care.  No orders of the defined types were placed in this encounter.  The patient has a good understanding of the overall plan. she agrees with it. she will call with any problems that may develop before the next visit here.   Rulon Eisenmenger, MD 04/19/16

## 2016-06-11 DIAGNOSIS — Z961 Presence of intraocular lens: Secondary | ICD-10-CM | POA: Diagnosis not present

## 2016-06-11 DIAGNOSIS — H04123 Dry eye syndrome of bilateral lacrimal glands: Secondary | ICD-10-CM | POA: Diagnosis not present

## 2016-06-11 DIAGNOSIS — H26492 Other secondary cataract, left eye: Secondary | ICD-10-CM | POA: Diagnosis not present

## 2016-06-11 DIAGNOSIS — H35033 Hypertensive retinopathy, bilateral: Secondary | ICD-10-CM | POA: Diagnosis not present

## 2016-06-11 DIAGNOSIS — H5213 Myopia, bilateral: Secondary | ICD-10-CM | POA: Diagnosis not present

## 2016-07-22 DIAGNOSIS — E1149 Type 2 diabetes mellitus with other diabetic neurological complication: Secondary | ICD-10-CM | POA: Diagnosis not present

## 2016-09-28 ENCOUNTER — Other Ambulatory Visit: Payer: Self-pay | Admitting: Family Medicine

## 2016-09-28 DIAGNOSIS — Z1231 Encounter for screening mammogram for malignant neoplasm of breast: Secondary | ICD-10-CM

## 2016-09-28 DIAGNOSIS — Z9011 Acquired absence of right breast and nipple: Secondary | ICD-10-CM

## 2016-09-30 DIAGNOSIS — E7800 Pure hypercholesterolemia, unspecified: Secondary | ICD-10-CM | POA: Diagnosis not present

## 2016-09-30 DIAGNOSIS — C50911 Malignant neoplasm of unspecified site of right female breast: Secondary | ICD-10-CM | POA: Diagnosis not present

## 2016-09-30 DIAGNOSIS — F419 Anxiety disorder, unspecified: Secondary | ICD-10-CM | POA: Diagnosis not present

## 2016-09-30 DIAGNOSIS — E1142 Type 2 diabetes mellitus with diabetic polyneuropathy: Secondary | ICD-10-CM | POA: Diagnosis not present

## 2016-09-30 DIAGNOSIS — I10 Essential (primary) hypertension: Secondary | ICD-10-CM | POA: Diagnosis not present

## 2016-09-30 DIAGNOSIS — E1149 Type 2 diabetes mellitus with other diabetic neurological complication: Secondary | ICD-10-CM | POA: Diagnosis not present

## 2016-09-30 DIAGNOSIS — M810 Age-related osteoporosis without current pathological fracture: Secondary | ICD-10-CM | POA: Diagnosis not present

## 2016-09-30 DIAGNOSIS — D126 Benign neoplasm of colon, unspecified: Secondary | ICD-10-CM | POA: Diagnosis not present

## 2016-09-30 DIAGNOSIS — F4320 Adjustment disorder, unspecified: Secondary | ICD-10-CM | POA: Diagnosis not present

## 2016-09-30 DIAGNOSIS — Z0000 Encounter for general adult medical examination without abnormal findings: Secondary | ICD-10-CM | POA: Diagnosis not present

## 2016-09-30 DIAGNOSIS — I348 Other nonrheumatic mitral valve disorders: Secondary | ICD-10-CM | POA: Diagnosis not present

## 2016-09-30 DIAGNOSIS — K219 Gastro-esophageal reflux disease without esophagitis: Secondary | ICD-10-CM | POA: Diagnosis not present

## 2016-10-04 DIAGNOSIS — E1149 Type 2 diabetes mellitus with other diabetic neurological complication: Secondary | ICD-10-CM | POA: Diagnosis not present

## 2016-10-05 ENCOUNTER — Other Ambulatory Visit: Payer: Self-pay | Admitting: *Deleted

## 2016-10-05 NOTE — Patient Outreach (Signed)
[facility] Screening call. Pt requested a return call later.  Melody Tran. [name], MSN, [facility] Gerontological Nurse Practitioner [facility] Care Management [phone]

## 2016-10-07 ENCOUNTER — Encounter: Payer: Self-pay | Admitting: *Deleted

## 2016-10-07 ENCOUNTER — Other Ambulatory Visit: Payer: Self-pay | Admitting: *Deleted

## 2016-10-07 NOTE — Patient Outreach (Signed)
HTA THN Screen. Pt sees primary care MD routinely Kelton Pillar). She has had her AWV last week. Takes medications as ordered for HTN. Diet controlled diabetes, fbs are always between 80-120. She is independent. She has a daughter who lives locally.  No acute health concerns. No care management needs. Introductory letter sent.  Eulah Pont. [name], MSN, [facility] Gerontological Nurse Practitioner [facility] Management [phone]

## 2016-10-19 ENCOUNTER — Ambulatory Visit
Admission: RE | Admit: 2016-10-19 | Discharge: 2016-10-19 | Disposition: A | Discharge status: Home / Self Care | Payer: PPO | Source: Ambulatory Visit | Attending: Family Medicine | Admitting: Family Medicine

## 2016-10-19 DIAGNOSIS — Z1231 Encounter for screening mammogram for malignant neoplasm of breast: Secondary | ICD-10-CM

## 2016-10-19 DIAGNOSIS — Z9011 Acquired absence of right breast and nipple: Secondary | ICD-10-CM

## 2016-11-24 DIAGNOSIS — M1711 Unilateral primary osteoarthritis, right knee: Secondary | ICD-10-CM | POA: Diagnosis not present

## 2016-11-24 DIAGNOSIS — M1712 Unilateral primary osteoarthritis, left knee: Secondary | ICD-10-CM | POA: Diagnosis not present

## 2016-12-06 DIAGNOSIS — M1711 Unilateral primary osteoarthritis, right knee: Secondary | ICD-10-CM | POA: Diagnosis not present

## 2016-12-06 DIAGNOSIS — M1712 Unilateral primary osteoarthritis, left knee: Secondary | ICD-10-CM | POA: Diagnosis not present

## 2016-12-15 DIAGNOSIS — M1711 Unilateral primary osteoarthritis, right knee: Secondary | ICD-10-CM | POA: Diagnosis not present

## 2016-12-15 DIAGNOSIS — M1712 Unilateral primary osteoarthritis, left knee: Secondary | ICD-10-CM | POA: Diagnosis not present

## 2016-12-27 DIAGNOSIS — M1712 Unilateral primary osteoarthritis, left knee: Secondary | ICD-10-CM | POA: Diagnosis not present

## 2016-12-27 DIAGNOSIS — M1711 Unilateral primary osteoarthritis, right knee: Secondary | ICD-10-CM | POA: Diagnosis not present

## 2016-12-31 ENCOUNTER — Ambulatory Visit
Admission: RE | Admit: 2016-12-31 | Discharge: 2016-12-31 | Disposition: A | Discharge status: Home / Self Care | Payer: PPO | Source: Ambulatory Visit | Attending: Family Medicine | Admitting: Family Medicine

## 2016-12-31 ENCOUNTER — Other Ambulatory Visit: Payer: Self-pay | Admitting: Family Medicine

## 2016-12-31 DIAGNOSIS — H53452 Other localized visual field defect, left eye: Secondary | ICD-10-CM

## 2016-12-31 DIAGNOSIS — S0990XA Unspecified injury of head, initial encounter: Secondary | ICD-10-CM | POA: Diagnosis not present

## 2016-12-31 DIAGNOSIS — H5340 Unspecified visual field defects: Secondary | ICD-10-CM | POA: Diagnosis not present

## 2016-12-31 DIAGNOSIS — W19XXXA Unspecified fall, initial encounter: Secondary | ICD-10-CM | POA: Diagnosis not present

## 2016-12-31 DIAGNOSIS — R51 Headache: Secondary | ICD-10-CM | POA: Diagnosis not present

## 2017-01-05 DIAGNOSIS — H26492 Other secondary cataract, left eye: Secondary | ICD-10-CM | POA: Diagnosis not present

## 2017-01-05 DIAGNOSIS — E119 Type 2 diabetes mellitus without complications: Secondary | ICD-10-CM | POA: Diagnosis not present

## 2017-01-05 DIAGNOSIS — H53452 Other localized visual field defect, left eye: Secondary | ICD-10-CM | POA: Diagnosis not present

## 2017-01-05 DIAGNOSIS — H5319 Other subjective visual disturbances: Secondary | ICD-10-CM | POA: Diagnosis not present

## 2017-01-05 DIAGNOSIS — H35033 Hypertensive retinopathy, bilateral: Secondary | ICD-10-CM | POA: Diagnosis not present

## 2017-01-10 DIAGNOSIS — H5213 Myopia, bilateral: Secondary | ICD-10-CM | POA: Diagnosis not present

## 2017-01-10 DIAGNOSIS — H5319 Other subjective visual disturbances: Secondary | ICD-10-CM | POA: Diagnosis not present

## 2017-01-10 DIAGNOSIS — H524 Presbyopia: Secondary | ICD-10-CM | POA: Diagnosis not present

## 2017-01-10 DIAGNOSIS — H26492 Other secondary cataract, left eye: Secondary | ICD-10-CM | POA: Diagnosis not present

## 2017-01-10 DIAGNOSIS — H52223 Regular astigmatism, bilateral: Secondary | ICD-10-CM | POA: Diagnosis not present

## 2017-01-10 DIAGNOSIS — H04123 Dry eye syndrome of bilateral lacrimal glands: Secondary | ICD-10-CM | POA: Diagnosis not present

## 2017-01-10 DIAGNOSIS — E119 Type 2 diabetes mellitus without complications: Secondary | ICD-10-CM | POA: Diagnosis not present

## 2017-01-10 DIAGNOSIS — H35033 Hypertensive retinopathy, bilateral: Secondary | ICD-10-CM | POA: Diagnosis not present

## 2017-01-10 DIAGNOSIS — H53453 Other localized visual field defect, bilateral: Secondary | ICD-10-CM | POA: Diagnosis not present

## 2017-01-20 DIAGNOSIS — I6389 Other cerebral infarction: Secondary | ICD-10-CM | POA: Diagnosis not present

## 2017-01-20 DIAGNOSIS — J320 Chronic maxillary sinusitis: Secondary | ICD-10-CM | POA: Diagnosis not present

## 2017-01-20 DIAGNOSIS — H547 Unspecified visual loss: Secondary | ICD-10-CM | POA: Diagnosis not present

## 2017-01-20 DIAGNOSIS — H5319 Other subjective visual disturbances: Secondary | ICD-10-CM | POA: Diagnosis not present

## 2017-03-24 DIAGNOSIS — I639 Cerebral infarction, unspecified: Secondary | ICD-10-CM | POA: Diagnosis not present

## 2017-03-24 DIAGNOSIS — I69391 Dysphagia following cerebral infarction: Secondary | ICD-10-CM | POA: Diagnosis not present

## 2017-03-24 DIAGNOSIS — R488 Other symbolic dysfunctions: Secondary | ICD-10-CM | POA: Diagnosis not present

## 2017-03-24 DIAGNOSIS — R1312 Dysphagia, oropharyngeal phase: Secondary | ICD-10-CM | POA: Diagnosis not present

## 2017-04-04 DIAGNOSIS — I6930 Unspecified sequelae of cerebral infarction: Secondary | ICD-10-CM | POA: Diagnosis not present

## 2017-04-04 DIAGNOSIS — I639 Cerebral infarction, unspecified: Secondary | ICD-10-CM | POA: Diagnosis not present

## 2017-04-04 DIAGNOSIS — E7800 Pure hypercholesterolemia, unspecified: Secondary | ICD-10-CM | POA: Diagnosis not present

## 2017-04-04 DIAGNOSIS — I10 Essential (primary) hypertension: Secondary | ICD-10-CM | POA: Diagnosis not present

## 2017-04-04 DIAGNOSIS — E1149 Type 2 diabetes mellitus with other diabetic neurological complication: Secondary | ICD-10-CM | POA: Diagnosis not present

## 2017-04-04 DIAGNOSIS — F419 Anxiety disorder, unspecified: Secondary | ICD-10-CM | POA: Diagnosis not present

## 2017-04-04 DIAGNOSIS — E1142 Type 2 diabetes mellitus with diabetic polyneuropathy: Secondary | ICD-10-CM | POA: Diagnosis not present

## 2017-04-11 DIAGNOSIS — I69391 Dysphagia following cerebral infarction: Secondary | ICD-10-CM | POA: Diagnosis not present

## 2017-04-11 DIAGNOSIS — R1312 Dysphagia, oropharyngeal phase: Secondary | ICD-10-CM | POA: Diagnosis not present

## 2017-04-11 DIAGNOSIS — R488 Other symbolic dysfunctions: Secondary | ICD-10-CM | POA: Diagnosis not present

## 2017-04-11 DIAGNOSIS — I639 Cerebral infarction, unspecified: Secondary | ICD-10-CM | POA: Diagnosis not present

## 2017-04-12 DIAGNOSIS — I69391 Dysphagia following cerebral infarction: Secondary | ICD-10-CM | POA: Diagnosis not present

## 2017-04-12 DIAGNOSIS — R1312 Dysphagia, oropharyngeal phase: Secondary | ICD-10-CM | POA: Diagnosis not present

## 2017-04-12 DIAGNOSIS — I639 Cerebral infarction, unspecified: Secondary | ICD-10-CM | POA: Diagnosis not present

## 2017-04-12 DIAGNOSIS — R488 Other symbolic dysfunctions: Secondary | ICD-10-CM | POA: Diagnosis not present

## 2017-04-18 DIAGNOSIS — I639 Cerebral infarction, unspecified: Secondary | ICD-10-CM | POA: Diagnosis not present

## 2017-04-18 DIAGNOSIS — R488 Other symbolic dysfunctions: Secondary | ICD-10-CM | POA: Diagnosis not present

## 2017-04-18 DIAGNOSIS — R1312 Dysphagia, oropharyngeal phase: Secondary | ICD-10-CM | POA: Diagnosis not present

## 2017-04-18 DIAGNOSIS — I69391 Dysphagia following cerebral infarction: Secondary | ICD-10-CM | POA: Diagnosis not present

## 2017-04-20 DIAGNOSIS — I69391 Dysphagia following cerebral infarction: Secondary | ICD-10-CM | POA: Diagnosis not present

## 2017-04-20 DIAGNOSIS — R1312 Dysphagia, oropharyngeal phase: Secondary | ICD-10-CM | POA: Diagnosis not present

## 2017-04-20 DIAGNOSIS — R488 Other symbolic dysfunctions: Secondary | ICD-10-CM | POA: Diagnosis not present

## 2017-04-20 DIAGNOSIS — I639 Cerebral infarction, unspecified: Secondary | ICD-10-CM | POA: Diagnosis not present

## 2017-04-21 ENCOUNTER — Encounter: Payer: Self-pay | Admitting: Adult Health

## 2017-04-29 ENCOUNTER — Encounter: Payer: Self-pay | Admitting: Adult Health

## 2017-04-29 ENCOUNTER — Inpatient Hospital Stay: Payer: PPO | Attending: Adult Health | Admitting: Adult Health

## 2017-04-29 VITALS — BP 151/68 | HR 89 | Temp 98.2°F | Resp 18 | Ht 66.0 in | Wt 124.6 lb

## 2017-04-29 DIAGNOSIS — Z853 Personal history of malignant neoplasm of breast: Secondary | ICD-10-CM | POA: Diagnosis not present

## 2017-04-29 DIAGNOSIS — C50411 Malignant neoplasm of upper-outer quadrant of right female breast: Secondary | ICD-10-CM

## 2017-04-29 DIAGNOSIS — I10 Essential (primary) hypertension: Secondary | ICD-10-CM | POA: Insufficient documentation

## 2017-04-29 DIAGNOSIS — Z170 Estrogen receptor positive status [ER+]: Secondary | ICD-10-CM

## 2017-04-29 NOTE — Progress Notes (Signed)
CLINIC:  Survivorship   REASON FOR VISIT:  Routine follow-up for history of breast cancer.   BRIEF ONCOLOGIC HISTORY:    Breast cancer of upper-outer quadrant of right female breast ([place])   10/31/2012 Surgery    Right breast mastectomy: Grade 2 invasive ductal carcinoma measuring 3.5 cm ER/PR positive HER-2 negative Ki-67 13%, 3 sentinel nodes negative T2 N0 M0 stage II a      11/15/2012 - 02/21/2013 Anti-estrogen oral therapy    Tamoxifen 20 mg daily discontinued due to side effects        INTERVAL HISTORY:  Melody Tran presents to the [facility] today for routine follow-up for her history of breast cancer.  Overall, she reports feeling quite well. She had a stroke in October.  Due to this she moved to assisted living in December.  The only residual effect she has is with her peripheral vision.  She is walking with her walker.  She is up to date with skin cancer screenings.  She sees her PCP regularly, and is up to date with mammogram.      REVIEW OF SYSTEMS:  Review of Systems  Constitutional: Negative for appetite change, chills, fatigue, fever and unexpected weight change.  HENT:   Negative for hearing loss, lump/mass, sore throat and trouble swallowing.   Eyes: Negative for eye problems and icterus.  Respiratory: Negative for chest tightness, cough and shortness of breath.   Cardiovascular: Negative for chest pain, leg swelling and palpitations.  Gastrointestinal: Negative for abdominal distention and abdominal pain.  Endocrine: Negative for hot flashes.  Musculoskeletal: Negative for arthralgias.  Skin: Negative for itching and rash.  Neurological: Negative for dizziness, extremity weakness, headaches and numbness.  Hematological: Negative for adenopathy. Does not bruise/bleed easily.  Psychiatric/Behavioral: Negative for depression. The patient is not nervous/anxious.   Breast: Denies any new nodularity, masses, tenderness, nipple changes, or nipple  discharge.       PAST MEDICAL/SURGICAL HISTORY:  Past Medical History:  Diagnosis Date  . Anxiety   . Arthritis   . Cancer ([place])   . Diabetes mellitus without complication ([place])   . GERD (gastroesophageal reflux disease)   . Hypertension   . Osteoporosis    Past Surgical History:  Procedure Laterality Date  . APPENDECTOMY    . BREAST SURGERY    . CHOLECYSTECTOMY    . EYE SURGERY    . KNEE SURGERY  2005  . MASTECTOMY Right 10/31/12  . MASTECTOMY W/ SENTINEL NODE BIOPSY Right 10/31/2012   Procedure: Right total MASTECTOMY WITH right axillary SENTINEL LYMPH NODE BIOPSY;  Surgeon: Adin Hector, MD;  Location: [place];  Service: General;  Laterality: Right;  . THUMB ARTHROSCOPY Left   . TONSILLECTOMY  1937  . TONSILLECTOMY       ALLERGIES:  No Active Allergies   CURRENT MEDICATIONS:  Outpatient Encounter Medications as of 04/29/2017  Medication Sig Note  . ALPRAZolam (XANAX) 0.5 MG tablet Take 0.25 mg by mouth at bedtime.    . Artificial Tear Solution (TEARS NATURALE OP) Place 1 drop into both eyes 4 (four) times daily. For Rosacea of eyes   . aspirin EC 81 MG tablet Take 81 mg by mouth daily.   . Biotin 1000 MCG tablet Take 1,000 mcg by mouth daily.   . Blood Glucose Monitoring Suppl (ONE TOUCH ULTRA 2) W/DEVICE KIT  04/27/2013: Received from: External Pharmacy  . Chlorphen-PE-Acetaminophen (CORICIDIN D COLD/FLU/SINUS PO) Take 325 mg by mouth as needed. 325-31m   .  Coenzyme Q10 (CO Q-10) 100 MG CAPS Take by mouth.   Marland Kitchen KRILL OIL PO Take 353 mg by mouth daily.   Marland Kitchen lisinopril (PRINIVIL,ZESTRIL) 5 MG tablet Take 5 mg by mouth daily with supper.  02/21/2013: Per pt she does not take this sometimes due to BP low and dizziness  . loratadine (CLARITIN) 10 MG tablet Take 10 mg by mouth daily. Alavert-Antihistamine   . ONE TOUCH ULTRA TEST test strip  04/27/2013: Received from: External Pharmacy  . ONETOUCH DELICA LANCETS FINE MISC  04/27/2013: Received from: External Pharmacy  .  ranitidine (ZANTAC) 75 MG tablet Take 75 mg by mouth 2 (two) times daily.   Marland Kitchen acetaminophen (TYLENOL) 325 MG tablet Take 325 mg by mouth daily as needed for pain.    Marland Kitchen UNABLE TO FIND 174.9 Malignant Neoplasm of Breast-Right  L8020- Mastectomy Form Replacement -1   . [DISCONTINUED] Calcium Carb-Cholecalciferol (CALCIUM 500 +D) 500-400 MG-UNIT TABS Take 1 tablet by mouth 2 (two) times daily.   . [DISCONTINUED] cholecalciferol (VITAMIN D) 1000 UNITS tablet Take 1,000 Units by mouth daily.   . [DISCONTINUED] Magnesium 250 MG TABS Take 250 mg by mouth daily.     No facility-administered encounter medications on file as of 04/29/2017.      ONCOLOGIC FAMILY HISTORY:  Family History  Problem Relation Age of Onset  . Anemia Mother   . Breast cancer Daughter   . Breast cancer Cousin        PHYSICAL EXAMINATION:  Vital Signs: Vitals:   04/29/17 1436  BP: (!) 151/68  Pulse: 89  Resp: 18  Temp: 98.2 F (36.8 C)  SpO2: 99%   Filed Weights   04/29/17 1436  Weight: 124 lb 9.6 oz (56.5 kg)   General: Well-nourished, well-appearing female in no acute distress.  Accompanied by her daughter today.   HEENT: Head is normocephalic.  Pupils equal and reactive to light. Conjunctivae clear without exudate.  Sclerae anicteric. Oral mucosa is pink, moist.  Oropharynx is pink without lesions or erythema.  Lymph: No cervical, supraclavicular, or infraclavicular lymphadenopathy noted on palpation.  Cardiovascular: Regular rate and rhythm.Marland Kitchen Respiratory: Clear to auscultation bilaterally. Chest expansion symmetric; breathing non-labored.  Breast Exam:  -Left breast: No appreciable masses on palpation. No skin redness, thickening, or peau d'orange appearance; no nipple retraction or nipple discharge; -Right breast: surgically absent, no nodules, masses, tenderness, or sign of recurrence. -Axilla: No axillary adenopathy bilaterally.  GI: Abdomen soft and round; non-tender, non-distended. Bowel sounds  normoactive. No hepatosplenomegaly.   GU: Deferred.  Neuro: No focal deficits. Steady gait.  Psych: Mood and affect normal and appropriate for situation.  MSK: No focal spinal tenderness to palpation, full range of motion in bilateral upper extremities Extremities: No edema. Skin: Warm and dry.  LABORATORY DATA:  None for this visit   DIAGNOSTIC IMAGING:  Most recent mammogram:normal at the breast center    ASSESSMENT AND PLAN:  Ms.. Brundage is a pleasant 84 y.o. female with history of Stage IIA right breast invasive ductal carcinoma, ER+/PR+/HER2-, diagnosed in 10/2012, treated with mastectomy followed by Tamoxifen x 3 months but was unable to tolerate.  [facility] for surveillance and routine follow-up.   1. History of breast cancer:  Ms. Woodring is currently clinically and radiographically without evidence of disease or recurrence of breast cancer. She will be due for mammogram in 10/2017; orders placed today. She is now 5 years out from her breast cancer diagnosis with no sign of recurrence.  This is very favorable.  She will graduate today and f/u with her PCP annually for her breast exam and for her mammogram to be ordered.  If at any point in the future she needs [place], she knows to call and we will see her.      2. Cancer screening:  Due to Ms. Saliba's history and her age, she should receive screening for skin cancers. She was encouraged to follow-up with her PCP for appropriate cancer screenings.   3. Health maintenance and wellness promotion: Ms. Dibbern was encouraged to consume 5-7 servings of fruits and vegetables per day. She was also encouraged to engage in moderate to vigorous exercise for 30 minutes per day most days of the week. She was instructed to limit her alcohol consumption and continue to abstain from tobacco use.    Dispo:  -Return to cancer center PRN -Mammogram in 10/2017   A total of (30) minutes of face-to-face time was  spent with this patient with greater than 50% of that time in counseling and care-coordination.   Melody Tran, [place] [phone]   Note: PRIMARY CARE PROVIDER Kelton Pillar, [place] [phone]

## 2017-05-02 ENCOUNTER — Telehealth: Payer: Self-pay | Admitting: Adult Health

## 2017-05-02 NOTE — Telephone Encounter (Signed)
Per 2/22 no los °

## 2017-06-22 DIAGNOSIS — M170 Bilateral primary osteoarthritis of knee: Secondary | ICD-10-CM | POA: Diagnosis not present

## 2017-07-20 DIAGNOSIS — M1712 Unilateral primary osteoarthritis, left knee: Secondary | ICD-10-CM | POA: Diagnosis not present

## 2017-07-20 DIAGNOSIS — M1711 Unilateral primary osteoarthritis, right knee: Secondary | ICD-10-CM | POA: Diagnosis not present

## 2017-07-27 DIAGNOSIS — M1711 Unilateral primary osteoarthritis, right knee: Secondary | ICD-10-CM | POA: Diagnosis not present

## 2017-07-27 DIAGNOSIS — M1712 Unilateral primary osteoarthritis, left knee: Secondary | ICD-10-CM | POA: Diagnosis not present

## 2017-08-03 DIAGNOSIS — M1712 Unilateral primary osteoarthritis, left knee: Secondary | ICD-10-CM | POA: Diagnosis not present

## 2017-08-03 DIAGNOSIS — M1711 Unilateral primary osteoarthritis, right knee: Secondary | ICD-10-CM | POA: Diagnosis not present

## 2017-09-20 ENCOUNTER — Other Ambulatory Visit: Payer: Self-pay | Admitting: Family Medicine

## 2017-09-20 DIAGNOSIS — Z1231 Encounter for screening mammogram for malignant neoplasm of breast: Secondary | ICD-10-CM

## 2017-10-03 DIAGNOSIS — F419 Anxiety disorder, unspecified: Secondary | ICD-10-CM | POA: Diagnosis not present

## 2017-10-03 DIAGNOSIS — F4320 Adjustment disorder, unspecified: Secondary | ICD-10-CM | POA: Diagnosis not present

## 2017-10-03 DIAGNOSIS — E1149 Type 2 diabetes mellitus with other diabetic neurological complication: Secondary | ICD-10-CM | POA: Diagnosis not present

## 2017-10-03 DIAGNOSIS — D126 Benign neoplasm of colon, unspecified: Secondary | ICD-10-CM | POA: Diagnosis not present

## 2017-10-03 DIAGNOSIS — Z0000 Encounter for general adult medical examination without abnormal findings: Secondary | ICD-10-CM | POA: Diagnosis not present

## 2017-10-03 DIAGNOSIS — I348 Other nonrheumatic mitral valve disorders: Secondary | ICD-10-CM | POA: Diagnosis not present

## 2017-10-03 DIAGNOSIS — Z1389 Encounter for screening for other disorder: Secondary | ICD-10-CM | POA: Diagnosis not present

## 2017-10-03 DIAGNOSIS — M1990 Unspecified osteoarthritis, unspecified site: Secondary | ICD-10-CM | POA: Diagnosis not present

## 2017-10-03 DIAGNOSIS — C50911 Malignant neoplasm of unspecified site of right female breast: Secondary | ICD-10-CM | POA: Diagnosis not present

## 2017-10-03 DIAGNOSIS — I10 Essential (primary) hypertension: Secondary | ICD-10-CM | POA: Diagnosis not present

## 2017-10-03 DIAGNOSIS — H9190 Unspecified hearing loss, unspecified ear: Secondary | ICD-10-CM | POA: Diagnosis not present

## 2017-10-03 DIAGNOSIS — E7800 Pure hypercholesterolemia, unspecified: Secondary | ICD-10-CM | POA: Diagnosis not present

## 2017-11-08 ENCOUNTER — Ambulatory Visit
Admission: RE | Admit: 2017-11-08 | Discharge: 2017-11-08 | Disposition: A | Discharge status: Home / Self Care | Payer: PPO | Source: Ambulatory Visit | Attending: Family Medicine | Admitting: Family Medicine

## 2017-11-08 DIAGNOSIS — Z1231 Encounter for screening mammogram for malignant neoplasm of breast: Secondary | ICD-10-CM

## 2018-04-06 DIAGNOSIS — E1149 Type 2 diabetes mellitus with other diabetic neurological complication: Secondary | ICD-10-CM | POA: Diagnosis not present

## 2018-04-06 DIAGNOSIS — E1142 Type 2 diabetes mellitus with diabetic polyneuropathy: Secondary | ICD-10-CM | POA: Diagnosis not present

## 2018-04-06 DIAGNOSIS — I10 Essential (primary) hypertension: Secondary | ICD-10-CM | POA: Diagnosis not present

## 2018-04-06 DIAGNOSIS — E7800 Pure hypercholesterolemia, unspecified: Secondary | ICD-10-CM | POA: Diagnosis not present

## 2018-04-06 DIAGNOSIS — F419 Anxiety disorder, unspecified: Secondary | ICD-10-CM | POA: Diagnosis not present

## 2018-04-19 DIAGNOSIS — M170 Bilateral primary osteoarthritis of knee: Secondary | ICD-10-CM | POA: Diagnosis not present

## 2018-04-19 DIAGNOSIS — M1711 Unilateral primary osteoarthritis, right knee: Secondary | ICD-10-CM | POA: Diagnosis not present

## 2018-04-26 DIAGNOSIS — M1711 Unilateral primary osteoarthritis, right knee: Secondary | ICD-10-CM | POA: Diagnosis not present

## 2018-04-26 DIAGNOSIS — M1712 Unilateral primary osteoarthritis, left knee: Secondary | ICD-10-CM | POA: Diagnosis not present

## 2018-05-03 DIAGNOSIS — M1712 Unilateral primary osteoarthritis, left knee: Secondary | ICD-10-CM | POA: Diagnosis not present

## 2018-05-03 DIAGNOSIS — M1711 Unilateral primary osteoarthritis, right knee: Secondary | ICD-10-CM | POA: Diagnosis not present

## 2018-08-16 DIAGNOSIS — Z20828 Contact with and (suspected) exposure to other viral communicable diseases: Secondary | ICD-10-CM | POA: Diagnosis not present

## 2018-10-26 DIAGNOSIS — R319 Hematuria, unspecified: Secondary | ICD-10-CM | POA: Diagnosis not present

## 2018-10-26 DIAGNOSIS — N390 Urinary tract infection, site not specified: Secondary | ICD-10-CM | POA: Diagnosis not present

## 2018-10-31 DIAGNOSIS — F419 Anxiety disorder, unspecified: Secondary | ICD-10-CM | POA: Diagnosis not present

## 2018-10-31 DIAGNOSIS — G4700 Insomnia, unspecified: Secondary | ICD-10-CM | POA: Diagnosis not present

## 2018-11-23 DIAGNOSIS — J309 Allergic rhinitis, unspecified: Secondary | ICD-10-CM | POA: Diagnosis not present

## 2018-11-23 DIAGNOSIS — Z0000 Encounter for general adult medical examination without abnormal findings: Secondary | ICD-10-CM | POA: Diagnosis not present

## 2018-11-23 DIAGNOSIS — Z1389 Encounter for screening for other disorder: Secondary | ICD-10-CM | POA: Diagnosis not present

## 2018-11-23 DIAGNOSIS — E1142 Type 2 diabetes mellitus with diabetic polyneuropathy: Secondary | ICD-10-CM | POA: Diagnosis not present

## 2018-11-23 DIAGNOSIS — Z23 Encounter for immunization: Secondary | ICD-10-CM | POA: Diagnosis not present

## 2018-11-23 DIAGNOSIS — K219 Gastro-esophageal reflux disease without esophagitis: Secondary | ICD-10-CM | POA: Diagnosis not present

## 2018-11-23 DIAGNOSIS — G4700 Insomnia, unspecified: Secondary | ICD-10-CM | POA: Diagnosis not present

## 2018-11-23 DIAGNOSIS — E7800 Pure hypercholesterolemia, unspecified: Secondary | ICD-10-CM | POA: Diagnosis not present

## 2018-11-23 DIAGNOSIS — F419 Anxiety disorder, unspecified: Secondary | ICD-10-CM | POA: Diagnosis not present

## 2018-11-23 DIAGNOSIS — E1149 Type 2 diabetes mellitus with other diabetic neurological complication: Secondary | ICD-10-CM | POA: Diagnosis not present

## 2018-11-23 DIAGNOSIS — I10 Essential (primary) hypertension: Secondary | ICD-10-CM | POA: Diagnosis not present

## 2018-11-23 DIAGNOSIS — I639 Cerebral infarction, unspecified: Secondary | ICD-10-CM | POA: Diagnosis not present

## 2018-12-14 DIAGNOSIS — Z20828 Contact with and (suspected) exposure to other viral communicable diseases: Secondary | ICD-10-CM | POA: Diagnosis not present

## 2018-12-20 DIAGNOSIS — Z9189 Other specified personal risk factors, not elsewhere classified: Secondary | ICD-10-CM | POA: Diagnosis not present

## 2018-12-26 DIAGNOSIS — Z9189 Other specified personal risk factors, not elsewhere classified: Secondary | ICD-10-CM | POA: Diagnosis not present

## 2019-01-02 DIAGNOSIS — Z20828 Contact with and (suspected) exposure to other viral communicable diseases: Secondary | ICD-10-CM | POA: Diagnosis not present

## 2019-01-02 DIAGNOSIS — Z9189 Other specified personal risk factors, not elsewhere classified: Secondary | ICD-10-CM | POA: Diagnosis not present

## 2019-01-09 DIAGNOSIS — Z20828 Contact with and (suspected) exposure to other viral communicable diseases: Secondary | ICD-10-CM | POA: Diagnosis not present

## 2019-01-09 DIAGNOSIS — Z9189 Other specified personal risk factors, not elsewhere classified: Secondary | ICD-10-CM | POA: Diagnosis not present

## 2019-01-17 DIAGNOSIS — Z9189 Other specified personal risk factors, not elsewhere classified: Secondary | ICD-10-CM | POA: Diagnosis not present

## 2019-01-23 DIAGNOSIS — Z9189 Other specified personal risk factors, not elsewhere classified: Secondary | ICD-10-CM | POA: Diagnosis not present

## 2019-01-31 DIAGNOSIS — Z20828 Contact with and (suspected) exposure to other viral communicable diseases: Secondary | ICD-10-CM | POA: Diagnosis not present

## 2019-01-31 DIAGNOSIS — Z9189 Other specified personal risk factors, not elsewhere classified: Secondary | ICD-10-CM | POA: Diagnosis not present

## 2019-02-19 DIAGNOSIS — Z9189 Other specified personal risk factors, not elsewhere classified: Secondary | ICD-10-CM | POA: Diagnosis not present

## 2019-02-19 DIAGNOSIS — Z20828 Contact with and (suspected) exposure to other viral communicable diseases: Secondary | ICD-10-CM | POA: Diagnosis not present

## 2019-02-23 DIAGNOSIS — Z9189 Other specified personal risk factors, not elsewhere classified: Secondary | ICD-10-CM | POA: Diagnosis not present

## 2019-02-23 DIAGNOSIS — Z20828 Contact with and (suspected) exposure to other viral communicable diseases: Secondary | ICD-10-CM | POA: Diagnosis not present

## 2019-02-26 DIAGNOSIS — Z9189 Other specified personal risk factors, not elsewhere classified: Secondary | ICD-10-CM | POA: Diagnosis not present

## 2019-02-26 DIAGNOSIS — Z20828 Contact with and (suspected) exposure to other viral communicable diseases: Secondary | ICD-10-CM | POA: Diagnosis not present

## 2019-02-27 DIAGNOSIS — E46 Unspecified protein-calorie malnutrition: Secondary | ICD-10-CM | POA: Diagnosis not present

## 2019-02-27 DIAGNOSIS — F419 Anxiety disorder, unspecified: Secondary | ICD-10-CM | POA: Diagnosis not present

## 2019-03-12 DIAGNOSIS — Z9189 Other specified personal risk factors, not elsewhere classified: Secondary | ICD-10-CM | POA: Diagnosis not present

## 2019-03-12 DIAGNOSIS — Z20828 Contact with and (suspected) exposure to other viral communicable diseases: Secondary | ICD-10-CM | POA: Diagnosis not present

## 2019-03-19 DIAGNOSIS — Z20828 Contact with and (suspected) exposure to other viral communicable diseases: Secondary | ICD-10-CM | POA: Diagnosis not present

## 2019-03-19 DIAGNOSIS — Z9189 Other specified personal risk factors, not elsewhere classified: Secondary | ICD-10-CM | POA: Diagnosis not present

## 2019-03-26 DIAGNOSIS — Z20828 Contact with and (suspected) exposure to other viral communicable diseases: Secondary | ICD-10-CM | POA: Diagnosis not present

## 2019-03-26 DIAGNOSIS — Z9189 Other specified personal risk factors, not elsewhere classified: Secondary | ICD-10-CM | POA: Diagnosis not present

## 2019-04-02 DIAGNOSIS — Z20828 Contact with and (suspected) exposure to other viral communicable diseases: Secondary | ICD-10-CM | POA: Diagnosis not present

## 2019-04-02 DIAGNOSIS — Z9189 Other specified personal risk factors, not elsewhere classified: Secondary | ICD-10-CM | POA: Diagnosis not present

## 2019-04-09 DIAGNOSIS — Z20828 Contact with and (suspected) exposure to other viral communicable diseases: Secondary | ICD-10-CM | POA: Diagnosis not present

## 2019-04-09 DIAGNOSIS — Z9189 Other specified personal risk factors, not elsewhere classified: Secondary | ICD-10-CM | POA: Diagnosis not present

## 2019-05-25 DIAGNOSIS — E1149 Type 2 diabetes mellitus with other diabetic neurological complication: Secondary | ICD-10-CM | POA: Diagnosis not present

## 2019-05-25 DIAGNOSIS — E46 Unspecified protein-calorie malnutrition: Secondary | ICD-10-CM | POA: Diagnosis not present

## 2019-05-25 DIAGNOSIS — E1142 Type 2 diabetes mellitus with diabetic polyneuropathy: Secondary | ICD-10-CM | POA: Diagnosis not present

## 2019-05-25 DIAGNOSIS — E7800 Pure hypercholesterolemia, unspecified: Secondary | ICD-10-CM | POA: Diagnosis not present

## 2019-05-25 DIAGNOSIS — F419 Anxiety disorder, unspecified: Secondary | ICD-10-CM | POA: Diagnosis not present

## 2019-05-25 DIAGNOSIS — I10 Essential (primary) hypertension: Secondary | ICD-10-CM | POA: Diagnosis not present

## 2019-06-18 DIAGNOSIS — D649 Anemia, unspecified: Secondary | ICD-10-CM | POA: Diagnosis not present

## 2019-06-18 DIAGNOSIS — Z79899 Other long term (current) drug therapy: Secondary | ICD-10-CM | POA: Diagnosis not present

## 2019-06-18 DIAGNOSIS — E039 Hypothyroidism, unspecified: Secondary | ICD-10-CM | POA: Diagnosis not present

## 2019-06-18 LAB — CBC AND DIFFERENTIAL
HCT: 39 (ref 36–46)
Hemoglobin: 12.9 (ref 12.0–16.0)
Platelets: 324 (ref 150–399)
WBC: 6.9

## 2019-06-18 LAB — CBC: RBC: 4.3 (ref 3.87–5.11)

## 2019-06-18 LAB — BASIC METABOLIC PANEL
BUN: 36 — AB (ref 4–21)
CO2: 24 — AB (ref 13–22)
Chloride: 99 (ref 99–108)
Creatinine: 0.8 (ref 0.5–1.1)
Glucose: 321
Potassium: 4.6 (ref 3.4–5.3)
Sodium: 139 (ref 137–147)

## 2019-06-18 LAB — COMPREHENSIVE METABOLIC PANEL
Albumin: 4.1 (ref 3.5–5.0)
Calcium: 9.7 (ref 8.7–10.7)
Globulin: 2.4

## 2019-06-18 LAB — TSH: TSH: 0.63 (ref 0.41–5.90)

## 2019-06-21 ENCOUNTER — Non-Acute Institutional Stay (SKILLED_NURSING_FACILITY): Payer: PPO | Admitting: Adult Health

## 2019-06-21 ENCOUNTER — Encounter: Payer: Self-pay | Admitting: Adult Health

## 2019-06-21 DIAGNOSIS — H539 Unspecified visual disturbance: Secondary | ICD-10-CM

## 2019-06-21 DIAGNOSIS — J302 Other seasonal allergic rhinitis: Secondary | ICD-10-CM

## 2019-06-21 DIAGNOSIS — R058 Other specified cough: Secondary | ICD-10-CM

## 2019-06-21 DIAGNOSIS — F0150 Vascular dementia without behavioral disturbance: Secondary | ICD-10-CM

## 2019-06-21 DIAGNOSIS — I10 Essential (primary) hypertension: Secondary | ICD-10-CM

## 2019-06-21 DIAGNOSIS — F5101 Primary insomnia: Secondary | ICD-10-CM

## 2019-06-21 DIAGNOSIS — R634 Abnormal weight loss: Secondary | ICD-10-CM

## 2019-06-21 DIAGNOSIS — F418 Other specified anxiety disorders: Secondary | ICD-10-CM | POA: Insufficient documentation

## 2019-06-21 DIAGNOSIS — C50411 Malignant neoplasm of upper-outer quadrant of right female breast: Secondary | ICD-10-CM | POA: Diagnosis not present

## 2019-06-21 DIAGNOSIS — I69398 Other sequelae of cerebral infarction: Secondary | ICD-10-CM | POA: Insufficient documentation

## 2019-06-21 DIAGNOSIS — Z9189 Other specified personal risk factors, not elsewhere classified: Secondary | ICD-10-CM | POA: Diagnosis not present

## 2019-06-21 DIAGNOSIS — R300 Dysuria: Secondary | ICD-10-CM

## 2019-06-21 DIAGNOSIS — R05 Cough: Secondary | ICD-10-CM

## 2019-06-21 DIAGNOSIS — Z170 Estrogen receptor positive status [ER+]: Secondary | ICD-10-CM

## 2019-06-21 DIAGNOSIS — Z20828 Contact with and (suspected) exposure to other viral communicable diseases: Secondary | ICD-10-CM | POA: Diagnosis not present

## 2019-06-21 DIAGNOSIS — B370 Candidal stomatitis: Secondary | ICD-10-CM | POA: Diagnosis not present

## 2019-06-21 DIAGNOSIS — E119 Type 2 diabetes mellitus without complications: Secondary | ICD-10-CM | POA: Diagnosis not present

## 2019-06-21 DIAGNOSIS — Z7189 Other specified counseling: Secondary | ICD-10-CM | POA: Diagnosis not present

## 2019-06-21 HISTORY — DX: Essential (primary) hypertension: I10

## 2019-06-21 HISTORY — DX: Type 2 diabetes mellitus without complications: E11.9

## 2019-06-21 HISTORY — DX: Vascular dementia, unspecified severity, without behavioral disturbance, psychotic disturbance, mood disturbance, and anxiety: F01.50

## 2019-06-21 HISTORY — DX: Unspecified visual disturbance: H53.9

## 2019-06-21 HISTORY — DX: Other specified anxiety disorders: F41.8

## 2019-06-21 HISTORY — DX: Primary insomnia: F51.01

## 2019-06-21 HISTORY — DX: Other seasonal allergic rhinitis: J30.2

## 2019-06-21 NOTE — Progress Notes (Signed)
Location:  Occupational psychologist of Service:  SNF (31) Provider:   Cindi Carbon, [place] [phone]   Kelton Pillar, MD  Patient Care Team: Kelton Pillar, MD as PCP - General [facility] Medicine)  Extended Emergency Contact Information Primary Emergency Contact: [name],[name]  [place] Phone: [phone] Relation: Daughter  Code Status:  DNR Goals of care: Advanced Directive information Advanced Directives 06/21/2019  Does Patient Have a Medical Advance Directive? Yes  Type of Paramedic of [place];Out of facility DNR (pink MOST or yellow form);Living will  Does patient want to make changes to medical advance directive? No - Patient declined  Copy of [place] in Chart? Yes - validated most recent copy scanned in chart (See row information)  Pre-existing out of facility DNR order (yellow form or pink MOST form) Yellow form placed in chart (order not valid for inpatient use);Pink MOST form placed in chart (order not valid for inpatient use)     Chief Complaint  Patient presents with  . Acute Visit    cough with sputum production     HPI:  Pt is a 86 y.o. female seen today for an acute visit for a cough with sputum production. Melody Tran is admitted to skilled care due to progressive weight loss and dementia. PMH significant for CVA in 05/01/16 with visual loss, anxiety with OCD, breast ca s/p right mastectomy, GERD, HTN, OP, vascular dementia, DM II, insomnia, and seasonal allergies.  Ms Robledo was previously in assisted living but was losing weight and was needing more assistance and cuing. She assess at a skilled level of care. She has lost 15 lbs in the past 5 months due to decreased intake. The nurses from [facility] report that she has a flat affect and is resistant to change and does not participate in activities. I called her daughter and she feels that since the pandemic  Ms. Gieske has been more depressed and flat but has chronically not been an animated person. Her daughter reports that since Mr. Renderos passed away in 05-02-2015 and even then they were noticing changes in her. She also has OCD like behaviors (counting and having to have things in a certain place). She was previously followed by Dr. Laurann Montana. She placed her on Seroquel several months ago for the OCD/anxiety and she was having insomnia, which seemed to help.   In regards to the cough it has been present for a week. Seems to be worse with thin liquids. Now she is having lots of sputum production. No fever, or decreased 02 sats.  Speech therapy was ordered but her daughter is not inclined to pursue this due to her goals of care. She wants Ms. Tamburo kept comfortable and does not want to intervene in her decline due to her poor QOL. Also, Ms. Borkowski is already on a D1 puree diet by choice as she only likes baby food. She is also drinking milk shakes occasionally. Intake is very poor overall.  She is reporting burning upon urination. Staff has encouraged oral fluid which hasn't helped as she is not compliant.  Her bp has been soft so lisinopril was discontinued (also due to cough)  She is fully vaccinated for covid. Labs reviewed included CBC, BMP TSH. BUN was 36 and glucose was over 300 but this was not fasting and is after taking a milk shake.    FRAIL-NH Score Fatigue: 0 Resistance: 1 Ambulation : 1 Incontinence :  1 Loss of Weight: 2 Nutritional Approach : 1 Help with Dressing: 1  Total: 7 (06/21/2019  3:43 PM)    Past Medical History:  Diagnosis Date  . Anxiety   . Arthritis   . Cancer ([place])   . Diabetes mellitus without complication ([place])   . GERD (gastroesophageal reflux disease)   . Hypertension   . Osteoporosis    Past Surgical History:  Procedure Laterality Date  . APPENDECTOMY    . BREAST SURGERY    . CHOLECYSTECTOMY    . EYE SURGERY    . KNEE SURGERY  2005  .  MASTECTOMY Right 10/31/12  . MASTECTOMY W/ SENTINEL NODE BIOPSY Right 10/31/2012   Procedure: Right total MASTECTOMY WITH right axillary SENTINEL LYMPH NODE BIOPSY;  Surgeon: Adin Hector, MD;  Location: [place];  Service: General;  Laterality: Right;  . THUMB ARTHROSCOPY Left   . TONSILLECTOMY  1937  . TONSILLECTOMY      Allergies  Allergen Reactions  . Ace Inhibitors   . Angiotensin Receptor Blockers   . Beta Adrenergic Blockers   . Clonidine Derivatives   . Evista [Raloxifene]   . Fosamax [Alendronate]   . Hctz [Hydrochlorothiazide]   . Hygroton [Chlorthalidone]   . Norvasc [Amlodipine]     Outpatient Encounter Medications as of 06/21/2019  Medication Sig  . Coenzyme Q10 (CO Q-10) 100 MG CAPS Take by mouth.  . famotidine (PEPCID) 20 MG tablet Take 20 mg by mouth 2 (two) times daily as needed for heartburn or indigestion.  Marland Kitchen loratadine (CLARITIN) 10 MG tablet Take 10 mg by mouth daily. Alavert-Antihistamine  . polyethylene glycol (MIRALAX / GLYCOLAX) 17 g packet Take 17 g by mouth daily as needed.  Marland Kitchen QUEtiapine (SEROQUEL) 50 MG tablet Take 50 mg by mouth at bedtime.  Marland Kitchen acetaminophen (TYLENOL) 325 MG tablet Take 325 mg by mouth daily as needed for pain.   Marland Kitchen ALPRAZolam (XANAX) 0.5 MG tablet Take 0.25 mg by mouth at bedtime as needed.   . Artificial Tear Solution (TEARS NATURALE OP) Place 1 drop into both eyes 2 (two) times daily as needed. For Rosacea of eyes  . aspirin EC 81 MG tablet Take 81 mg by mouth daily.  . Biotin 1000 MCG tablet Take 1,000 mcg by mouth daily.  . Blood Glucose Monitoring Suppl (ONE TOUCH ULTRA 2) W/DEVICE KIT   . Chlorphen-PE-Acetaminophen (CORICIDIN D COLD/FLU/SINUS PO) Take 325 mg by mouth as needed. 325-45m  . KRILL OIL PO Take 353 mg by mouth daily.  . ONE TOUCH ULTRA TEST test strip   . ONETOUCH DELICA LANCETS FINE MISC   . UNABLE TO FIND 174.9 Malignant Neoplasm of Breast-Right  L8020- Mastectomy Form Replacement -1  . [DISCONTINUED] lisinopril  (PRINIVIL,ZESTRIL) 5 MG tablet Take 5 mg by mouth daily with supper.   . [DISCONTINUED] ranitidine (ZANTAC) 75 MG tablet Take 75 mg by mouth 2 (two) times daily.   No facility-administered encounter medications on file as of 06/21/2019.    Review of Systems  Constitutional: Positive for appetite change and unexpected weight change. Negative for activity change, chills, diaphoresis, fatigue and fever.  HENT: Positive for trouble swallowing. Negative for congestion, sinus pressure, sinus pain, sneezing and sore throat.   Respiratory: Positive for cough. Negative for shortness of breath and wheezing.   Cardiovascular: Negative for chest pain, palpitations and leg swelling.  Gastrointestinal: Negative for abdominal distention, abdominal pain, constipation, diarrhea, nausea, rectal pain and vomiting.  Genitourinary: Positive for dysuria. Negative for decreased urine  volume, difficulty urinating, flank pain, frequency, hematuria, menstrual problem, pelvic pain and urgency.  Musculoskeletal: Positive for gait problem (uses walker). Negative for arthralgias, back pain, joint swelling and myalgias.  Neurological: Negative for dizziness, tremors, seizures, syncope, facial asymmetry, speech difficulty, weakness, light-headedness, numbness and headaches.  Psychiatric/Behavioral: Positive for behavioral problems and confusion. Negative for agitation, hallucinations, self-injury, sleep disturbance and suicidal ideas. The patient is not nervous/anxious and is not hyperactive.     Immunization History  Administered Date(s) Administered  . Moderna SARS-COVID-2 Vaccination 03/19/2019, 04/17/2019  . Pneumococcal Conjugate-13 10/26/2007  . Tdap 02/22/2011   Pertinent  Health Maintenance Due  Topic Date Due  . FOOT EXAM  Never done  . OPHTHALMOLOGY EXAM  Never done  . URINE MICROALBUMIN  Never done  . PNA vac Low Risk Adult (2 of 2 - PPSV23) 10/25/2008  . HEMOGLOBIN A1C  12/21/2019  . DEXA SCAN  Completed     Fall Risk  06/21/2019  Falls in the past year? 1  Number falls in past yr: 0  Injury with Fall? 1  Risk for fall due to : History of fall(s);Impaired balance/gait  Follow up Falls evaluation completed   Functional Status Survey: Is the patient deaf or have difficulty hearing?: Yes Does the patient have difficulty seeing, even when wearing glasses/contacts?: Yes Does the patient have difficulty concentrating, remembering, or making decisions?: Yes Does the patient have difficulty walking or climbing stairs?: Yes Does the patient have difficulty dressing or bathing?: Yes Does the patient have difficulty doing errands alone such as visiting a doctor's office or shopping?: Yes  Vitals:   06/21/19 1138  BP: (!) 94/52  Pulse: 87  Resp: 17  Temp: 97.8 F (36.6 C)  SpO2: 97%   There is no height or weight on file to calculate BMI. Physical Exam Vitals and nursing note reviewed.  Constitutional:      General: She is not in acute distress.    Appearance: She is not diaphoretic.     Comments: Frail thin female  HENT:     Head: Normocephalic and atraumatic.     Mouth/Throat:     Mouth: Mucous membranes are moist.     Pharynx: Oropharynx is clear. No oropharyngeal exudate.     Comments: White patches to tongue Eyes:     General:        Right eye: No discharge.        Left eye: No discharge.     Conjunctiva/sclera: Conjunctivae normal.     Pupils: Pupils are equal, round, and reactive to light.  Neck:     Vascular: No carotid bruit or JVD.  Cardiovascular:     Rate and Rhythm: Normal rate and regular rhythm.     Heart sounds: No murmur.  Pulmonary:     Effort: Pulmonary effort is normal. No respiratory distress.     Breath sounds: Normal breath sounds. No wheezing.  Abdominal:     General: Bowel sounds are normal. There is no distension.     Palpations: Abdomen is soft. There is no mass.     Tenderness: There is abdominal tenderness (s/p area). There is no right CVA  tenderness or left CVA tenderness.  Musculoskeletal:        General: No swelling, tenderness, deformity or signs of injury.     Cervical back: No rigidity or tenderness.     Right lower leg: No edema.     Left lower leg: No edema.  Lymphadenopathy:  Cervical: No cervical adenopathy.  Skin:    General: Skin is warm and dry.  Neurological:     General: No focal deficit present.     Mental Status: She is alert.     Cranial Nerves: No cranial nerve deficit.     Comments: Oriented to self and place but not time. Able to f/c.Confused about the details of her care  Psychiatric:     Comments: Very flat     Labs reviewed: No results for input(s): NA, K, CL, CO2, GLUCOSE, BUN, CREATININE, CALCIUM, MG, PHOS in the last 8760 hours. No results for input(s): AST, ALT, ALKPHOS, BILITOT, PROT, ALBUMIN in the last 8760 hours. No results for input(s): WBC, NEUTROABS, HGB, HCT, MCV, PLT in the last 8760 hours. No results found for: TSH No results found for: HGBA1C No results found for: CHOL, HDL, LDLCALC, LDLDIRECT, TRIG, CHOLHDL  Significant Diagnostic Results in last 30 days:  MM 3D SCREEN BREAST UNI LEFT  Result Date: 11/08/2017 CLINICAL DATA:  Screening. EXAM: DIGITAL SCREENING UNILATERAL LEFT MAMMOGRAM WITH CAD AND TOMO COMPARISON:  Previous exam(s). ACR Breast Density Category c: The breast tissue is heterogeneously dense, which may obscure small masses. FINDINGS: The patient has had a right mastectomy. There are no findings suspicious for malignancy. Images were processed with CAD. IMPRESSION: No mammographic evidence of malignancy. A result letter of this screening mammogram will be mailed directly to the patient. RECOMMENDATION: Screening mammogram in one year.  (Code:SM-L-18M) BI-RADS CATEGORY  1: Negative. Electronically Signed   By: Fidela Salisbury M.D.   On: 11/08/2017 15:23    Assessment/Plan 1. Productive cough Check CXR and Covid swab and place on isolation and until result  returns negative May be due to chronic aspiration   2. Dysuria With associated bladder tenderness Check UA C and S, also needs micro albumin  3. Weight loss Due to decreased intake with progressive dementia/depression  Her daughter declined to try Remeron as Ms. Kegg does not want any new medications and becomes very anxious with routine changes and does not want to intervene in her progressive decline (see ACP) Continue boost shakes and D1 diet   4. Diabetes mellitus without complication ([place]) Needs A1C Diet controlled in the past Goals of care are comfort based so will only continue CBGs weekly which haven been WNL Fastin CBG elevated on labs but this was after a milk shake  5. Essential hypertension Running low so lisinopril was discontinued.   6. Vascular dementia without behavioral disturbance ([place]) Progressive decline, skilled is the appropriate level of care for her at this time. MMSE 21/30 05/29/19  7. Malignant neoplasm of upper-outer quadrant of right breast in female, estrogen receptor positive ([place]) S/p mastectomy  8. Other specified anxiety disorders With associated OCD behaviors which improved with serqouel Continue Seroquel per daughters request which also helped with sleep  9. Primary insomnia As above   10. Seasonal allergies Continue claritin 10 mg qd. Could add flonase of sputum production does not improve  11. Advanced care planning/counseling discussion I spoke with Ms. Zenia Resides, Ms. Caputi's daughter (POA). She reports that her mother has been declining with weight loss and dementia. She does not want [place] to intervene with any work up for the weight loss and appetite stimulants due to her mother's own expressed wishes in the past and poor QOL. We discussed a most form and filled one out today indicating a DNR, no hospitalizations, no tube feeding, no IVs, and determine the use of antibiotics. If  Ms. Wardrip had an infection that was causing discomfort  she would want it treated.  I spent 18 minutes in counseling and discussion regarding this patient.  12. Thrush Clotrimazole lozenges 10 mg QID x 1 week  Family/ staff Communication: discussed with her daughter Ms. [name]   Labs/tests ordered:  A1C, CXR, UA C and S

## 2019-06-22 DIAGNOSIS — E119 Type 2 diabetes mellitus without complications: Secondary | ICD-10-CM | POA: Diagnosis not present

## 2019-06-22 DIAGNOSIS — R319 Hematuria, unspecified: Secondary | ICD-10-CM | POA: Diagnosis not present

## 2019-06-22 DIAGNOSIS — N390 Urinary tract infection, site not specified: Secondary | ICD-10-CM | POA: Diagnosis not present

## 2019-06-22 DIAGNOSIS — R300 Dysuria: Secondary | ICD-10-CM | POA: Diagnosis not present

## 2019-06-22 LAB — HEMOGLOBIN A1C: Hemoglobin A1C: 7.9

## 2019-06-26 ENCOUNTER — Encounter: Payer: Self-pay | Admitting: Internal Medicine

## 2019-06-26 ENCOUNTER — Non-Acute Institutional Stay (SKILLED_NURSING_FACILITY): Payer: PPO | Admitting: Internal Medicine

## 2019-06-26 DIAGNOSIS — F322 Major depressive disorder, single episode, severe without psychotic features: Secondary | ICD-10-CM

## 2019-06-26 DIAGNOSIS — E119 Type 2 diabetes mellitus without complications: Secondary | ICD-10-CM

## 2019-06-26 DIAGNOSIS — C50411 Malignant neoplasm of upper-outer quadrant of right female breast: Secondary | ICD-10-CM

## 2019-06-26 DIAGNOSIS — F422 Mixed obsessional thoughts and acts: Secondary | ICD-10-CM | POA: Diagnosis not present

## 2019-06-26 DIAGNOSIS — R1312 Dysphagia, oropharyngeal phase: Secondary | ICD-10-CM

## 2019-06-26 DIAGNOSIS — R634 Abnormal weight loss: Secondary | ICD-10-CM | POA: Diagnosis not present

## 2019-06-26 DIAGNOSIS — F5101 Primary insomnia: Secondary | ICD-10-CM | POA: Diagnosis not present

## 2019-06-26 DIAGNOSIS — F0150 Vascular dementia without behavioral disturbance: Secondary | ICD-10-CM | POA: Diagnosis not present

## 2019-06-26 DIAGNOSIS — I10 Essential (primary) hypertension: Secondary | ICD-10-CM | POA: Diagnosis not present

## 2019-06-26 DIAGNOSIS — B370 Candidal stomatitis: Secondary | ICD-10-CM | POA: Diagnosis not present

## 2019-06-26 DIAGNOSIS — Z170 Estrogen receptor positive status [ER+]: Secondary | ICD-10-CM | POA: Diagnosis not present

## 2019-06-26 NOTE — Progress Notes (Signed)
Patient ID: Melody Tran, female   DOB: February 22, 1929, 86 y.o.   MRN: [id number]  Provider:  Rexene Edison. Mariea Clonts, D.O., C.M.D. Location:   [facility] Room Number: 116 Place of Service:  SNF (31)  PCP: Gayland Curry, DO Patient Care Team: Gayland Curry, DO as PCP - General (Geriatric Medicine) Royal Hawthorn, NP as Nurse Practitioner (Nurse Practitioner)  Extended Emergency Contact Information Primary Emergency Contact: [name],[name]  [place] Phone: [phone] Relation: Daughter  Code Status: DNR, MOST for comfort measures, trial of abx considered (but declined for suspected aspiration pna at present), no IVFs and no tube feedings  Goals of Care: Advanced Directive information Advanced Directives 06/26/2019  Does Patient Have a Medical Advance Directive? Yes  Type of Paramedic of [place];Out of facility DNR (pink MOST or yellow form)  Does patient want to make changes to medical advance directive? No - Patient declined  Copy of [place] in Chart? -  Pre-existing out of facility DNR order (yellow form or pink MOST form) Pink MOST/Yellow Form most recent copy in chart - Physician notified to receive inpatient order      Chief Complaint  Patient presents with  . New Admit To SNF    AL with failure to thrive and anorexia     HPI: Patient is a 86 y.o. female seen today for admission to [facility] as transfer from [facility] due to increased physical needs as she's been having failure to thrive and anorexia with 15 lb weight loss.  NP note reviewed in detail--she completed the MOST with pt's daughter as above.    When pt seen today, she is clearly very depressed and miserable.  Every answer was negative and she added more negative things I did not ask.  She c/o pain in her toes, chest tightness, coughing, poor vision, weakness, and overall malaise.  Appetite is poor--she did not like her shake she was drinking  saying it was warm (felt cold when I touched the cup myself).  She was coughing up sputum the same color as her shake.  She is extremely frail and uses rolling walker to ambulate.    Past Medical History:  Diagnosis Date  . Anxiety   . Arthritis   . Cancer ([place])   . CVA, old, disturbances of vision 06/21/2019   Noted in 2018   . Diabetes mellitus without complication ([place])   . Diabetes mellitus without complication ([place]) 08/27/2977  . Essential hypertension 06/21/2019  . GERD (gastroesophageal reflux disease)   . Hypertension   . Malignant neoplasm of upper-outer quadrant of right breast in female, estrogen receptor positive ([place]) 09/19/2012   ER/PR+  Her2 - Right IDC   . Osteoporosis   . Other specified anxiety disorders 06/21/2019  . Primary insomnia 06/21/2019  . Seasonal allergies 06/21/2019  . Vascular dementia without behavioral disturbance ([place]) 06/21/2019   Past Surgical History:  Procedure Laterality Date  . APPENDECTOMY    . BREAST SURGERY    . CHOLECYSTECTOMY    . EYE SURGERY    . KNEE SURGERY  2005  . MASTECTOMY Right 10/31/12  . MASTECTOMY W/ SENTINEL NODE BIOPSY Right 10/31/2012   Procedure: Right total MASTECTOMY WITH right axillary SENTINEL LYMPH NODE BIOPSY;  Surgeon: Adin Hector, MD;  Location: [facility];  Service: General;  Laterality: Right;  . THUMB ARTHROSCOPY Left   . TONSILLECTOMY  1937  . TONSILLECTOMY      Social History  Socioeconomic History  . Marital status: Married    Spouse name: Not on file  . Number of children: Not on file  . Years of education: Not on file  . Highest education level: Not on file  Occupational History  . Not on file  Tobacco Use  . Smoking status: Never Smoker  . Smokeless tobacco: Never Used  Substance and Sexual Activity  . Alcohol use: No  . Drug use: No  . Sexual activity: Not Currently  Other Topics Concern  . Not on file  Social History Narrative  . Not on file   Social Determinants of Health   Financial  Resource Strain:   . Difficulty of Paying Living Expenses:   Food Insecurity:   . Worried About Charity fundraiser in the Last Year:   . Arboriculturist in the Last Year:   Transportation Needs:   . Film/video editor (Medical):   Marland Kitchen Lack of Transportation (Non-Medical):   Physical Activity:   . Days of Exercise per Week:   . Minutes of Exercise per Session:   Stress:   . Feeling of Stress :   Social Connections:   . Frequency of Communication with Friends and Family:   . Frequency of Social Gatherings with Friends and Family:   . Attends Religious Services:   . Active Member of Clubs or Organizations:   . Attends Archivist Meetings:   Marland Kitchen Marital Status:     reports that she has never smoked. She has never used smokeless tobacco. She reports that she does not drink alcohol or use drugs.  Functional Status Survey:    Family History  Problem Relation Age of Onset  . Anemia Mother   . Breast cancer Daughter   . Breast cancer Cousin     Health Maintenance  Topic Date Due  . FOOT EXAM  Never done  . OPHTHALMOLOGY EXAM  Never done  . PNA vac Low Risk Adult (2 of 2 - PPSV23) 10/25/2008  . HEMOGLOBIN A1C  12/21/2019  . TETANUS/TDAP  02/21/2021  . DEXA SCAN  Completed  . COVID-19 Vaccine  Completed    Allergies  Allergen Reactions  . Ace Inhibitors   . Angiotensin Receptor Blockers   . Beta Adrenergic Blockers   . Clonidine Derivatives   . Evista [Raloxifene]   . Fosamax [Alendronate]   . Hctz [Hydrochlorothiazide]   . Hygroton [Chlorthalidone]   . Norvasc [Amlodipine]     Outpatient Encounter Medications as of 06/26/2019  Medication Sig  . acetaminophen (TYLENOL) 325 MG tablet Take 325 mg by mouth daily as needed for pain.   . Artificial Tear Solution (TEARS NATURALE OP) Place 1 drop into both eyes 2 (two) times daily as needed. For Rosacea of eyes  . aspirin EC 81 MG tablet Take 81 mg by mouth daily.  . famotidine (PEPCID) 20 MG tablet Take 20 mg by  mouth 2 (two) times daily as needed for heartburn or indigestion.  Marland Kitchen lisinopril (ZESTRIL) 5 MG tablet Take 5 mg by mouth daily.  Marland Kitchen loratadine (CLARITIN) 10 MG tablet Take 10 mg by mouth daily. Alavert-Antihistamine  . magnesium hydroxide (MILK OF MAGNESIA) 400 MG/5ML suspension Take by mouth daily as needed for mild constipation.  . polyethylene glycol (MIRALAX / GLYCOLAX) 17 g packet Take 17 g by mouth daily as needed.  Marland Kitchen QUEtiapine (SEROQUEL) 50 MG tablet Take 25 mg by mouth at bedtime.  . ranitidine (ZANTAC) 75 MG tablet Take 75 mg  by mouth 2 (two) times daily.  . [DISCONTINUED] ALPRAZolam (XANAX) 0.5 MG tablet Take 0.25 mg by mouth at bedtime as needed.   . [DISCONTINUED] Biotin 1000 MCG tablet Take 1,000 mcg by mouth daily.  . [DISCONTINUED] carboxymethylcellulose (REFRESH PLUS) 0.5 % SOLN 2 drops 2 (two) times daily as needed. 2 gtts to both eyes BID PRN for dryness please use home eye gtts, nurse to admin  . [DISCONTINUED] Chlorphen-PE-Acetaminophen (CORICIDIN D COLD/FLU/SINUS PO) Take 325 mg by mouth as needed. 325-45m  . [DISCONTINUED] Coenzyme Q10 (CO Q-10) 100 MG CAPS Take by mouth.  . [DISCONTINUED] UNABLE TO FIND 174.9 Malignant Neoplasm of Breast-Right  L8020- Mastectomy Form Replacement -1  . [DISCONTINUED] Blood Glucose Monitoring Suppl (ONE TOUCH ULTRA 2) W/DEVICE KIT   . [DISCONTINUED] clotrimazole (MYCELEX) 10 MG troche Take 10 mg by mouth 5 (five) times daily.  . [DISCONTINUED] KRILL OIL PO Take 353 mg by mouth daily.  . [DISCONTINUED] ONE TOUCH ULTRA TEST test strip   . [DISCONTINUED] ONETOUCH DELICA LANCETS FINE MISC    No facility-administered encounter medications on file as of 06/26/2019.    Review of Systems  Constitutional: Positive for malaise/fatigue and weight loss. Negative for chills and fever.  HENT: Negative for congestion and sore throat.   Eyes: Positive for blurred vision.  Respiratory: Positive for cough, sputum production and shortness of breath.     Cardiovascular: Positive for chest pain. Negative for palpitations and leg swelling.  Gastrointestinal: Positive for constipation and heartburn. Negative for abdominal pain, blood in stool, diarrhea and melena.  Genitourinary: Negative for dysuria.  Musculoskeletal: Negative for falls and joint pain.  Skin: Negative for rash.  Neurological: Negative for dizziness and loss of consciousness.  Endo/Heme/Allergies: Bruises/bleeds easily.  Psychiatric/Behavioral: Positive for depression and memory loss. Negative for hallucinations. The patient has insomnia. The patient is not nervous/anxious.     Vitals:   06/26/19 0858  BP: (!) 92/48  Pulse: 84  Temp: (!) 97 F (36.1 C)  Weight: 98 lb (44.5 kg)  Height: 5' 6"  (1.676 m)   Body mass index is 15.82 kg/m. Physical Exam Vitals reviewed.  Constitutional:      General: She is not in acute distress.    Appearance: She is ill-appearing. She is not toxic-appearing or diaphoretic.     Comments: Frail, cachectic appearing chronically ill, pale female seated in chair with walker in front of her  HENT:     Head: Normocephalic and atraumatic.     Right Ear: External ear normal.     Left Ear: External ear normal.     Nose: Nose normal.     Mouth/Throat:     Pharynx: No oropharyngeal exudate.  Eyes:     Extraocular Movements: Extraocular movements intact.     Pupils: Pupils are equal, round, and reactive to light.     Comments: Glasses, reports not seeing well  Cardiovascular:     Rate and Rhythm: Normal rate and regular rhythm.     Pulses: Normal pulses.     Heart sounds: Normal heart sounds.  Pulmonary:     Effort: Pulmonary effort is normal.     Breath sounds: No wheezing, rhonchi or rales.  Abdominal:     General: Bowel sounds are normal. There is no distension.     Palpations: Abdomen is soft.     Tenderness: There is no abdominal tenderness. There is no guarding or rebound.  Musculoskeletal:        General: Normal range of  motion.     Cervical back: Neck supple.     Right lower leg: No edema.     Left lower leg: No edema.  Skin:    General: Skin is warm and dry.     Coloration: Skin is pale.  Neurological:     General: No focal deficit present.     Mental Status: She is alert.     Motor: Weakness (uses rolling walker) present.     Gait: Gait abnormal.  Psychiatric:     Comments: Flat affect, did not smile once during visit     Labs reviewed: need records from past few years with Dr. Laurann Montana   Basic Metabolic Panel: No results for input(s): NA, K, CL, CO2, GLUCOSE, BUN, CREATININE, CALCIUM, MG, PHOS in the last 8760 hours. Liver Function Tests: No results for input(s): AST, ALT, ALKPHOS, BILITOT, PROT, ALBUMIN in the last 8760 hours. No results for input(s): LIPASE, AMYLASE in the last 8760 hours. No results for input(s): AMMONIA in the last 8760 hours. CBC: No results for input(s): WBC, NEUTROABS, HGB, HCT, MCV, PLT in the last 8760 hours. Cardiac Enzymes: No results for input(s): CKTOTAL, CKMB, CKMBINDEX, TROPONINI in the last 8760 hours. BNP: Invalid input(s): POCBNP No results found for: HGBA1C No results found for: TSH No results found for: VITAMINB12 No results found for: FOLATE No results found for: IRON, TIBC, FERRITIN  Imaging and Procedures obtained prior to SNF admission: MM 3D SCREEN BREAST UNI LEFT  Result Date: 11/08/2017 CLINICAL DATA:  Screening. EXAM: DIGITAL SCREENING UNILATERAL LEFT MAMMOGRAM WITH CAD AND TOMO COMPARISON:  Previous exam(s). ACR Breast Density Category c: The breast tissue is heterogeneously dense, which may obscure small masses. FINDINGS: The patient has had a right mastectomy. There are no findings suspicious for malignancy. Images were processed with CAD. IMPRESSION: No mammographic evidence of malignancy. A result letter of this screening mammogram will be mailed directly to the patient. RECOMMENDATION: Screening mammogram in one year.  (Code:SM-L-76M)  BI-RADS CATEGORY  1: Negative. Electronically Signed   By: Fidela Salisbury M.D.   On: 11/08/2017 15:23    Assessment/Plan 1. Depression, major, single episode, severe ([place]) This appears to be severe and longstanding, worsened amid covid -intake very poor and favors pureed foods and shakes only -has not had good success medication for mood and has gotten upset with med changes -recommend remeron, but there are concerns this will interfere with her normal trajectory toward end of life so family is not wanting this  2. Vascular dementia without behavioral disturbance ([place]) With her cognitive status with MMSE 21/30, I cannot imagine she will be fully aware of her medications one by one at this point  3. Malignant neoplasm of upper-outer quadrant of right breast in female, estrogen receptor positive ([place]) 2014 -prior mastectomy -this is a history of at this point  4. Weight loss -15 lbs in 5 mos -addition of remeron might help spirits and appetite, but pt is felt to be reaching end of life and family opts not to interfere with the trajectory -comfort-based goals  5. Diabetes mellitus without complication ([place]) -off cbgs due to very minimal intake  6. Essential hypertension -bp was running low so lisinopril was stopped--bp still soft even now with her poor intake  7. Mixed obsessional thoughts and acts -has historically been obsessive over med changes -reportedly seroquel helped her rest, but she's not resting here--lying awake at night reading in snf -seroquel not indicated for that and she's not had psychosis to  provide a reasonable indication for her -recommend gradual dose reduction since sleep remains an issue per staff  8. Primary insomnia -favor weaning seroquel b/c sleeping poorly here anyway, it increased risk of stroke (black box warning) and we don't have an approved indication  9. Thrush -completed clotrimazole therapy  10. Oropharyngeal dysphagia -appears she is  recurrently aspirating her pureed foods and milkshakes that she enjoys and now having periods of dyspnea -we added duonebs q6 hr prn sob, chest tightness today for this  D/c biotin, xanax (non-use during day when actually ordered per current order), coq10, cbgs already stopped and krill oil Recommend remeron in place of seroquel; however, due to potential to improve appetite, her daughter declined this suggestion due to possibly prolonging life which is agains pt's goals of care at present--wishes for only meds to improve qol (might be if helps depression and sleep, in my view) I recommend weaning seroquel also b/c the patient is still not sleeping well with it when observed routinely in snf setting (vs AL environment when not observed at those hours)--decrease seroquel to 7m nightly for 2 wks then stop  Family/ staff Communication: discussed with snf nurse, DON, AL nurse manager  Labs/tests ordered:  No new  Tondra Reierson L. Demarrio Menges, D.O. [facility] [address] [place] [id number] Phone (Mon-Fri 8am-5pm):  [phone] Call:  [phone]& follow prompts after 5pm & weekends Office Phone:  [phone] Fax:  [phone]

## 2019-07-09 ENCOUNTER — Encounter: Payer: Self-pay | Admitting: Internal Medicine

## 2019-07-10 DIAGNOSIS — Z79899 Other long term (current) drug therapy: Secondary | ICD-10-CM | POA: Diagnosis not present

## 2019-07-10 DIAGNOSIS — N390 Urinary tract infection, site not specified: Secondary | ICD-10-CM | POA: Diagnosis not present

## 2019-07-10 DIAGNOSIS — R05 Cough: Secondary | ICD-10-CM | POA: Diagnosis not present

## 2019-07-10 DIAGNOSIS — R319 Hematuria, unspecified: Secondary | ICD-10-CM | POA: Diagnosis not present

## 2019-07-10 DIAGNOSIS — R300 Dysuria: Secondary | ICD-10-CM | POA: Diagnosis not present

## 2019-07-12 ENCOUNTER — Encounter: Payer: Self-pay | Admitting: Adult Health

## 2019-07-12 ENCOUNTER — Non-Acute Institutional Stay (SKILLED_NURSING_FACILITY): Payer: PPO | Admitting: Adult Health

## 2019-07-12 DIAGNOSIS — Z66 Do not resuscitate: Secondary | ICD-10-CM

## 2019-07-12 DIAGNOSIS — R339 Retention of urine, unspecified: Secondary | ICD-10-CM | POA: Diagnosis not present

## 2019-07-12 DIAGNOSIS — N3000 Acute cystitis without hematuria: Secondary | ICD-10-CM | POA: Diagnosis not present

## 2019-07-12 DIAGNOSIS — K5901 Slow transit constipation: Secondary | ICD-10-CM | POA: Diagnosis not present

## 2019-07-12 DIAGNOSIS — F320 Major depressive disorder, single episode, mild: Secondary | ICD-10-CM | POA: Diagnosis not present

## 2019-07-12 NOTE — Progress Notes (Signed)
Location:  Occupational psychologist of Service:  SNF (31) Provider:   Cindi Tran, ANP [place] [phone]   Melody Curry, DO  Patient Care Team: Melody Curry, DO as PCP - General (Geriatric Medicine) Melody Hawthorn, NP as Nurse Practitioner (Nurse Practitioner)  Extended Emergency Contact Information Primary Emergency Contact: Melody Tran  [place] Phone: [phone] Relation: Daughter  Code Status:  DNR Goals of care: Advanced Directive information Advanced Directives 06/26/2019  Does Patient Have a Medical Advance Directive? Yes  Type of Paramedic of [place];Out of facility DNR (pink MOST or yellow form)  Does patient want to make changes to medical advance directive? No - Patient declined  Copy of [place] in Chart? -  Pre-existing out of facility DNR order (yellow form or pink MOST form) Pink MOST/Yellow Form most recent copy in chart - Physician notified to receive inpatient order     Chief Complaint  Patient presents with  . Acute Visit    dysuria, urinary retention, depression    HPI:  Pt is a 86 y.o. female seen today for an acute visit for dysuria, urinary retention, depression, and weight loss.   Ms. Melody Tran has been reporting dysuria after having a catheter due to retention. UA obtained 5/4  and results below. Cathter was replaced on 5/4 due recurrent retention and need for I and O cath. The staff reports that she has increased her fluid intake but continues to have low solid food intake. She has lost 2 more lbs since moving to skilled care after a period of progressive weight loss in [place]. We had offered to try Remeron for depression and appetite but her daughter had decided against this. During the most recent careplan meeting her daughter let us know that it was ok to try it. She does have a flat affect and lacks motivation, along with the decreased  appetite. She also has a hx of OCD and anxiety behaviors. In addition she is still going several days without a BM.   UA C and S 4 + bacteria, WBC TNTC, nitrite neg, leuk esterase 500, ex turbid, 1+ protein, 1+ ketones, 1+ blood, RBC 51-100, 80,000 colonies of gram pos cocci, sensitivities pending.  Past Medical History:  Diagnosis Date  . Anxiety   . Arthritis   . Cancer ([place])   . CVA, old, disturbances of vision 06/21/2019   Noted in 2018   . Diabetes mellitus without complication ([place])   . Diabetes mellitus without complication ([place]) 7/67/3419  . Essential hypertension 06/21/2019  . GERD (gastroesophageal reflux disease)   . Hypertension   . Malignant neoplasm of upper-outer quadrant of right breast in female, estrogen receptor positive ([place]) 09/19/2012   ER/PR+  Her2 - Right IDC   . Osteoporosis   . Other specified anxiety disorders 06/21/2019  . Primary insomnia 06/21/2019  . Seasonal allergies 06/21/2019  . Vascular dementia without behavioral disturbance ([place]) 06/21/2019   Past Surgical History:  Procedure Laterality Date  . APPENDECTOMY    . BREAST SURGERY    . CHOLECYSTECTOMY    . EYE SURGERY    . KNEE SURGERY  2005  . MASTECTOMY Right 10/31/12  . MASTECTOMY W/ SENTINEL NODE BIOPSY Right 10/31/2012   Procedure: Right total MASTECTOMY WITH right axillary SENTINEL LYMPH NODE BIOPSY;  Surgeon: Adin Hector, MD;  Location: [facility];  Service: General;  Laterality: Right;  . THUMB ARTHROSCOPY Left   .  TONSILLECTOMY  1937  . TONSILLECTOMY      Allergies  Allergen Reactions  . Ace Inhibitors   . Angiotensin Receptor Blockers   . Beta Adrenergic Blockers   . Clonidine Derivatives   . Evista [Raloxifene]   . Fosamax [Alendronate]   . Hctz [Hydrochlorothiazide]   . Hygroton [Chlorthalidone]   . Norvasc [Amlodipine]     Outpatient Encounter Medications as of 07/12/2019  Medication Sig  . Artificial Tear Solution (TEARS NATURALE OP) Place 1 drop into both eyes 2 (two) times  daily as needed. For Rosacea of eyes  . aspirin EC 81 MG tablet Take 81 mg by mouth daily.  . cephALEXin (KEFLEX) 500 MG capsule Take 500 mg by mouth 4 (four) times daily.  . famotidine (PEPCID) 20 MG tablet Take 20 mg by mouth 2 (two) times daily as needed for heartburn or indigestion.  Marland Kitchen ipratropium-albuterol (DUONEB) 0.5-2.5 (3) MG/3ML SOLN Take 3 mLs by nebulization every 6 (six) hours as needed.  . mirtazapine (REMERON) 15 MG tablet Take 7.5 mg by mouth at bedtime.  . senna-docusate (SENOKOT-S) 8.6-50 MG tablet Take 1 tablet by mouth at bedtime.  Marland Kitchen acetaminophen (TYLENOL) 325 MG tablet Take 325 mg by mouth daily as needed for pain.   Marland Kitchen loratadine (CLARITIN) 10 MG tablet Take 10 mg by mouth daily. Alavert-Antihistamine  . magnesium hydroxide (MILK OF MAGNESIA) 400 MG/5ML suspension Take by mouth daily as needed for mild constipation.  . polyethylene glycol (MIRALAX / GLYCOLAX) 17 g packet Take 17 g by mouth daily.   . [DISCONTINUED] famotidine (PEPCID) 20 MG tablet Take 20 mg by mouth 2 (two) times daily as needed for heartburn or indigestion.  . [DISCONTINUED] lisinopril (ZESTRIL) 5 MG tablet Take 5 mg by mouth daily.  . [DISCONTINUED] QUEtiapine (SEROQUEL) 50 MG tablet Take 25 mg by mouth at bedtime.  . [DISCONTINUED] ranitidine (ZANTAC) 75 MG tablet Take 75 mg by mouth 2 (two) times daily as needed.    No facility-administered encounter medications on file as of 07/12/2019.    Review of Systems  Constitutional: Positive for appetite change and unexpected weight change. Negative for activity change, chills, diaphoresis, fatigue and fever.  HENT: Negative for congestion.   Respiratory: Positive for cough (chronic due to aspiration). Negative for shortness of breath and wheezing.   Cardiovascular: Negative for chest pain, palpitations and leg swelling.  Gastrointestinal: Positive for constipation. Negative for abdominal distention, abdominal pain and diarrhea.  Genitourinary: Positive for  decreased urine volume, difficulty urinating and dysuria. Negative for flank pain, frequency, pelvic pain and urgency.  Musculoskeletal: Positive for gait problem. Negative for arthralgias, back pain, joint swelling and myalgias.  Neurological: Negative for dizziness, tremors, seizures, syncope, facial asymmetry, speech difficulty, weakness, light-headedness, numbness and headaches.  Psychiatric/Behavioral: Positive for confusion and dysphoric mood. Negative for agitation, behavioral problems, hallucinations, self-injury, sleep disturbance and suicidal ideas. The patient is nervous/anxious. The patient is not hyperactive.     Immunization History  Administered Date(s) Administered  . Moderna SARS-COVID-2 Vaccination 03/19/2019, 04/17/2019  . Pneumococcal Conjugate-13 10/26/2007  . Tdap 02/22/2011   Pertinent  Health Maintenance Due  Topic Date Due  . FOOT EXAM  Never done  . PNA vac Low Risk Adult (2 of 2 - PPSV23) 10/25/2008  . HEMOGLOBIN A1C  12/22/2019  . DEXA SCAN  Completed  . OPHTHALMOLOGY EXAM  Discontinued   Fall Risk  06/26/2019 06/21/2019  Falls in the past year? - 1  Number falls in past yr: - 0  Injury with Fall? - 1  Risk for fall due to : History of fall(s);Impaired balance/gait;Impaired mobility;Medication side effect;Mental status change;Impaired vision History of fall(s);Impaired balance/gait  Follow up Education provided;Falls evaluation completed;Falls prevention discussed Falls evaluation completed  Comment at [place] -   Functional Status Survey:    Vitals:   07/12/19 1205  Temp: 97.8 F (36.6 C)  Weight: 96 lb 12.8 oz (43.9 kg)   Body mass index is 15.62 kg/m.  Wt Readings from Last 3 Encounters:  07/12/19 96 lb 12.8 oz (43.9 kg)  06/26/19 98 lb (44.5 kg)  04/29/17 124 lb 9.6 oz (56.5 kg)    Physical Exam Vitals and nursing note reviewed.  Constitutional:      General: She is not in acute distress.    Appearance: She is not diaphoretic.      Comments: Frail thin female  HENT:     Head: Normocephalic and atraumatic.  Neck:     Vascular: No JVD.  Cardiovascular:     Rate and Rhythm: Normal rate and regular rhythm.     Heart sounds: No murmur.  Pulmonary:     Effort: Pulmonary effort is normal. No respiratory distress.     Breath sounds: Normal breath sounds. No wheezing.  Abdominal:     General: Bowel sounds are normal. There is no distension.     Palpations: Abdomen is soft. There is no mass.     Tenderness: There is abdominal tenderness (s/p area). There is no right CVA tenderness or left CVA tenderness.  Musculoskeletal:     Right lower leg: No edema.     Left lower leg: No edema.  Skin:    General: Skin is warm and dry.  Neurological:     General: No focal deficit present.     Mental Status: She is alert. Mental status is at baseline.  Psychiatric:     Comments: flat     Labs reviewed: Recent Labs    06/18/19 1023  NA 139  K 4.6  CL 99  CO2 24*  BUN 36*  CREATININE 0.8  CALCIUM 9.7   Recent Labs    06/18/19 1023  ALBUMIN 4.1   Recent Labs    06/18/19 1023  WBC 6.9  HGB 12.9  HCT 39  PLT 324   Lab Results  Component Value Date   TSH 0.63 06/18/2019   Lab Results  Component Value Date   HGBA1C 7.9 06/22/2019   No results found for: CHOL, HDL, LDLCALC, LDLDIRECT, TRIG, CHOLHDL  Significant Diagnostic Results in last 30 days:  No results found.  Assessment/Plan 1. DNR (do not resuscitate) - DNR (Do Not Resuscitate)  2. Acute cystitis without hematuria Most form indicates no antibiotics unless resident is uncomfortable. Given that she is having dysuria and requiring catheterization, will treat.  Keflex 500 mg QID x 7 days Encourage oral fluid  3. Urinary retention Keep foley due to frequent insertions recently. Will treat for infection and then discontinue on 5/10. Instructed staff to monitor output  4. Slow transit constipation Add senna s 1 tab qhs and continue miralax D/c  biotin due to size of pill  5. Depression, major, single episode, mild ([place]) Ms. Ternes has had s/s of depression and weight loss. Her daughter decided that she would like her mom to try Remeron in the care planning (reported to me by the supervisor). Will start 7.5 mg qhs.     Family/ staff Communication: resident   Labs/tests ordered:  NA

## 2019-07-16 ENCOUNTER — Encounter: Payer: Self-pay | Admitting: Adult Health

## 2019-07-16 ENCOUNTER — Non-Acute Institutional Stay (SKILLED_NURSING_FACILITY): Payer: PPO | Admitting: Adult Health

## 2019-07-16 DIAGNOSIS — K649 Unspecified hemorrhoids: Secondary | ICD-10-CM

## 2019-07-16 DIAGNOSIS — R1312 Dysphagia, oropharyngeal phase: Secondary | ICD-10-CM | POA: Diagnosis not present

## 2019-07-16 DIAGNOSIS — R52 Pain, unspecified: Secondary | ICD-10-CM | POA: Diagnosis not present

## 2019-07-16 DIAGNOSIS — N3000 Acute cystitis without hematuria: Secondary | ICD-10-CM | POA: Diagnosis not present

## 2019-07-16 DIAGNOSIS — F0150 Vascular dementia without behavioral disturbance: Secondary | ICD-10-CM | POA: Diagnosis not present

## 2019-07-16 DIAGNOSIS — K5901 Slow transit constipation: Secondary | ICD-10-CM

## 2019-07-16 DIAGNOSIS — R339 Retention of urine, unspecified: Secondary | ICD-10-CM | POA: Diagnosis not present

## 2019-07-16 DIAGNOSIS — R634 Abnormal weight loss: Secondary | ICD-10-CM | POA: Diagnosis not present

## 2019-07-16 MED ORDER — TRAMADOL HCL 50 MG PO TABS: 25.0000 mg | ORAL_TABLET | Freq: Two times a day (BID) | ORAL | 0 refills | Status: DC

## 2019-07-16 MED ORDER — TRAMADOL HCL 50 MG PO TABS: 25.0000 mg | ORAL_TABLET | Freq: Four times a day (QID) | ORAL | 0 refills | Status: DC | PRN

## 2019-07-16 NOTE — Progress Notes (Signed)
Location:  Occupational psychologist of Service:  SNF (31) Provider:   Cindi Carbon, ANP [place] [phone]  Gayland Curry, DO  Patient Care Team: Gayland Curry, DO as PCP - General (Geriatric Medicine) Royal Hawthorn, NP as Nurse Practitioner (Nurse Practitioner)  Extended Emergency Contact Information Primary Emergency Contact: [name],[name] Address: [address]          [place], [place] [address] [name] Phone: [phone] Work Phone: [phone] Mobile Phone: [phone] Relation: Daughter Secondary Emergency Contact: [name],[name] Address: [address]          [place], [place] [address] [name] Phone: [phone] Mobile Phone: [phone] Relation: Relative  Code Status:  DNR Goals of care: Advanced Directive information Advanced Directives 06/26/2019  Does Patient Have a Medical Advance Directive? Yes  Type of Paramedic of [place];Out of facility DNR (pink MOST or yellow form)  Does patient want to make changes to medical advance directive? No - Patient declined  Copy of [place] in Chart? -  Pre-existing out of facility DNR order (yellow form or pink MOST form) Pink MOST/Yellow Form most recent copy in chart - Physician notified to receive inpatient order     Chief Complaint  Patient presents with  . Acute Visit    hemorrhoids, pain, ?hospice    HPI:  Pt is a 86 y.o. female seen today for an acute visit for hemorrhoids, generalized pain, and possible hospice referral. She has a hx of advancing vascular dementia and now resides in skilled care. She has been losing weight due to decreased intake, depression, and dysphagia. She is down 28 lbs over the past year. The nurse reports that her bottom has been hurting and that she has a hemorrhoid. She also is having some constipation with bowel movements 2 x per week only.  She is not  having any abd pain, nausea, or vomiting. She continues to cough with meals and thin liquids. She eats very little, only shakes and occasional yogurt.  Wt Readings from Last 3 Encounters:  07/12/19 96 lb 12.8 oz (43.9 kg)  06/26/19 98 lb (44.5 kg)  04/29/17 124 lb 9.6 oz (56.5 kg)   She is saying "oh oh oh" when walking and in bed but can not elaborate on what is hurting her. She did say during my visit that her left hip hurt. Her family is requesting that she be kept comfortable and that we consider a hospice referral. She is currently receiving Cipro for a UTI. Her symptoms of dysuria are improved. She has had issues with urinary retention and had a foley catheter until this morning when it was removed. She has had one small void since that time.    Past Medical History:  Diagnosis Date  . Anxiety   . Arthritis   . Cancer ([place])   . CVA, old, disturbances of vision 06/21/2019   Noted in 2018   . Diabetes mellitus without complication ([place])   . Diabetes mellitus without complication ([place]) 3/64/6803  . Essential hypertension 06/21/2019  . GERD (gastroesophageal reflux disease)   . Hypertension   . Malignant neoplasm of upper-outer quadrant of right breast in female, estrogen receptor positive ([place]) 09/19/2012   ER/PR+  Her2 - Right IDC   . Osteoporosis   . Other specified anxiety disorders 06/21/2019  . Primary insomnia 06/21/2019  . Seasonal allergies 06/21/2019  . Vascular dementia without behavioral disturbance ([place]) 06/21/2019  Past Surgical History:  Procedure Laterality Date  . APPENDECTOMY    . BREAST SURGERY    . CHOLECYSTECTOMY    . EYE SURGERY    . KNEE SURGERY  2005  . MASTECTOMY Right 10/31/12  . MASTECTOMY W/ SENTINEL NODE BIOPSY Right 10/31/2012   Procedure: Right total MASTECTOMY WITH right axillary SENTINEL LYMPH NODE BIOPSY;  Surgeon: Adin Hector, MD;  Location: [facility];  Service: General;  Laterality: Right;  . THUMB ARTHROSCOPY Left   . TONSILLECTOMY  1937  .  TONSILLECTOMY      Allergies  Allergen Reactions  . Ace Inhibitors   . Angiotensin Receptor Blockers   . Beta Adrenergic Blockers   . Clonidine Derivatives   . Evista [Raloxifene]   . Fosamax [Alendronate]   . Hctz [Hydrochlorothiazide]   . Hygroton [Chlorthalidone]   . Norvasc [Amlodipine]     Outpatient Encounter Medications as of 07/16/2019  Medication Sig  . ciprofloxacin (CIPRO) 250 MG tablet Take 250 mg by mouth 2 (two) times daily.  Marland Kitchen saccharomyces boulardii (FLORASTOR) 250 MG capsule Take 250 mg by mouth 2 (two) times daily.  . traMADol (ULTRAM) 50 MG tablet Take 0.5 tablets (25 mg total) by mouth 2 (two) times daily.  . traMADol (ULTRAM) 50 MG tablet Take 0.5 tablets (25 mg total) by mouth every 6 (six) hours as needed.  . [DISCONTINUED] traMADol (ULTRAM) 50 MG tablet Take 25 mg by mouth 2 (two) times daily.  . [DISCONTINUED] traMADol (ULTRAM) 50 MG tablet Take 25 mg by mouth every 6 (six) hours as needed.  Marland Kitchen acetaminophen (TYLENOL) 325 MG tablet Take 325 mg by mouth daily as needed for pain.   . Artificial Tear Solution (TEARS NATURALE OP) Place 1 drop into both eyes 2 (two) times daily as needed. For Rosacea of eyes  . famotidine (PEPCID) 20 MG tablet Take 20 mg by mouth 2 (two) times daily as needed for heartburn or indigestion.  Marland Kitchen ipratropium-albuterol (DUONEB) 0.5-2.5 (3) MG/3ML SOLN Take 3 mLs by nebulization every 6 (six) hours as needed.  . loratadine (CLARITIN) 10 MG tablet Take 10 mg by mouth daily. Alavert-Antihistamine  . magnesium hydroxide (MILK OF MAGNESIA) 400 MG/5ML suspension Take by mouth daily as needed for mild constipation.  . mirtazapine (REMERON) 15 MG tablet Take 7.5 mg by mouth at bedtime.  . polyethylene glycol (MIRALAX / GLYCOLAX) 17 g packet Take 17 g by mouth daily.   Marland Kitchen senna-docusate (SENOKOT-S) 8.6-50 MG tablet Take 1 tablet by mouth at bedtime.  . [DISCONTINUED] aspirin EC 81 MG tablet Take 81 mg by mouth daily.  . [DISCONTINUED] cephALEXin  (KEFLEX) 500 MG capsule Take 500 mg by mouth 4 (four) times daily.   No facility-administered encounter medications on file as of 07/16/2019.    Review of Systems  Constitutional: Positive for activity change, appetite change and unexpected weight change. Negative for chills, diaphoresis, fatigue and fever.  HENT: Negative for congestion.   Respiratory: Positive for cough. Negative for shortness of breath and wheezing.   Cardiovascular: Negative for chest pain, palpitations and leg swelling.  Gastrointestinal: Positive for constipation. Negative for abdominal distention, abdominal pain and diarrhea.  Genitourinary: Negative for decreased urine volume, difficulty urinating, dysuria and pelvic pain.  Musculoskeletal: Positive for arthralgias and gait problem. Negative for back pain, joint swelling and myalgias.  Skin: Negative for wound.  Neurological: Negative for dizziness, tremors, seizures, syncope, facial asymmetry, speech difficulty, weakness, light-headedness, numbness and headaches.  Psychiatric/Behavioral: Positive for confusion. Negative for agitation,  behavioral problems and sleep disturbance. The patient is not nervous/anxious.     Immunization History  Administered Date(s) Administered  . Moderna SARS-COVID-2 Vaccination 03/19/2019, 04/17/2019  . Pneumococcal Conjugate-13 10/26/2007  . Tdap 02/22/2011   Pertinent  Health Maintenance Due  Topic Date Due  . FOOT EXAM  Never done  . PNA vac Low Risk Adult (2 of 2 - PPSV23) 10/25/2008  . URINE MICROALBUMIN  08/16/2019 (Originally 10/13/1938)  . HEMOGLOBIN A1C  12/22/2019  . DEXA SCAN  Completed  . OPHTHALMOLOGY EXAM  Discontinued   Fall Risk  06/26/2019 06/21/2019  Falls in the past year? - 1  Number falls in past yr: - 0  Injury with Fall? - 1  Risk for fall due to : History of fall(s);Impaired balance/gait;Impaired mobility;Medication side effect;Mental status change;Impaired vision History of fall(s);Impaired balance/gait    Follow up Education provided;Falls evaluation completed;Falls prevention discussed Falls evaluation completed  Comment at [place] -   Functional Status Survey:    Vitals:   07/16/19 1506  BP: (!) 84/56  Pulse: 95  Resp: 12  Temp: (!) 97.3 F (36.3 C)  SpO2: 93%   There is no height or weight on file to calculate BMI. Physical Exam Vitals and nursing note reviewed.  Constitutional:      General: She is not in acute distress.    Appearance: She is not diaphoretic.  HENT:     Head: Normocephalic and atraumatic.  Neck:     Vascular: No JVD.  Cardiovascular:     Rate and Rhythm: Regular rhythm. Tachycardia present.     Heart sounds: No murmur.  Pulmonary:     Effort: Pulmonary effort is normal. No respiratory distress.     Breath sounds: Normal breath sounds. No wheezing.  Abdominal:     General: Bowel sounds are normal. There is no distension.     Palpations: Abdomen is soft.     Tenderness: There is no abdominal tenderness. There is no right CVA tenderness.  Genitourinary:    Labia:        Right: No rash, tenderness or lesion.        Left: No rash, tenderness or lesion.      Rectum: Guaiac result negative. Internal hemorrhoid present. No mass, tenderness, anal fissure or external hemorrhoid.  Musculoskeletal:     Right lower leg: No edema.     Left lower leg: No edema.  Skin:    General: Skin is warm and dry.     Comments: Erythema blanchable to sacrum and ischium bilaterally  Neurological:     General: No focal deficit present.     Mental Status: She is alert. Mental status is at baseline.  Psychiatric:     Comments: Flat affect     Labs reviewed: Recent Labs    06/18/19 1023  NA 139  K 4.6  CL 99  CO2 24*  BUN 36*  CREATININE 0.8  CALCIUM 9.7   Recent Labs    06/18/19 1023  ALBUMIN 4.1   Recent Labs    06/18/19 1023  WBC 6.9  HGB 12.9  HCT 39  PLT 324   Lab Results  Component Value Date   TSH 0.63 06/18/2019   Lab Results   Component Value Date   HGBA1C 7.9 06/22/2019   No results found for: CHOL, HDL, LDLCALC, LDLDIRECT, TRIG, CHOLHDL  Significant Diagnostic Results in last 30 days:  No results found.  Assessment/Plan 1. Acute hemorrhoid Hemorrhoid PR qd x 5 days  2. Slow transit constipation Increase senna s 2 tabs bid   3. Oropharyngeal dysphagia Continues to cough with meals despite diet modifcation. No further treatment due to goals of care.     4. Weight loss and depressive symptoms  Continue Remeron 7.5 mg qhs  5. Urinary retention Catheter discontinued. May I and 0 cath q 8 prn. Would not frequently monitor bladder scanner to avoid frequent intervention given her goals of care. If she is not able urinate and becomes uncomfortable would place a catheter and leave it in place   6. Vascular dementia without behavioral disturbance (HCC) Progressive, needs more assistance now with ADLs. She is appropriately in skilled care. Goals of care are comfort based. She does not want to be "spoon fed" if she can no longer feed herself and they do not want her to be "pushed to eat".  Hospice consult ordered.   7. UTI Improvement noted in symptoms of dysuria Complete 7 day course of Cipro  8. Generalized pain Begin Tramadol 25 mg bid scheduled and q 6 hrs prn for reports of pain (pt is not able to provide location but appears to be in pain). Order mattress overlay for skin protection and joint pain in bed   D/C Co Q10 and aspirin D/C CBGS  Family/ staff Communication: discussed with Ms. Linna Hoff   Labs/tests ordered:  NA

## 2019-07-19 ENCOUNTER — Other Ambulatory Visit: Payer: Self-pay | Admitting: Adult Health

## 2019-07-19 MED ORDER — MORPHINE SULFATE (CONCENTRATE) 20 MG/ML PO SOLN: 5.0000 mg | Freq: Two times a day (BID) | ORAL | 0 refills | Status: AC

## 2019-07-19 MED ORDER — DOCUSATE SODIUM 50 MG/5ML PO LIQD: 100.0000 mg | Freq: Two times a day (BID) | ORAL | 3 refills | Status: AC

## 2019-07-19 MED ORDER — MORPHINE SULFATE (CONCENTRATE) 20 MG/ML PO SOLN: 5.0000 mg | Freq: Four times a day (QID) | ORAL | 0 refills | Status: AC | PRN

## 2019-07-19 NOTE — Progress Notes (Signed)
The nurse on the skilled 3 unit reports that Melody Tran is having pill dysphagia. Will d/c senna s , claritin, and ultram. She is having generalized pain of unclear etiology with progressive dementia and weight loss. Will begin Roxanol for comfort care. Hospice consult ordered earlier this week.

## 2019-07-30 ENCOUNTER — Encounter: Payer: Self-pay | Admitting: Internal Medicine

## 2019-08-07 DEATH — deceased

## 2019-11-13 IMAGING — MG DIGITAL SCREENING UNILATERAL LEFT MAMMOGRAM WITH CAD AND TOMO
4 series · 4 of 12 positions shown · non-contrast
Comparison: Previous exam(s).

CLINICAL DATA: Screening.

EXAM:
DIGITAL SCREENING UNILATERAL LEFT MAMMOGRAM WITH CAD AND TOMO

[L MLO synth-2D]
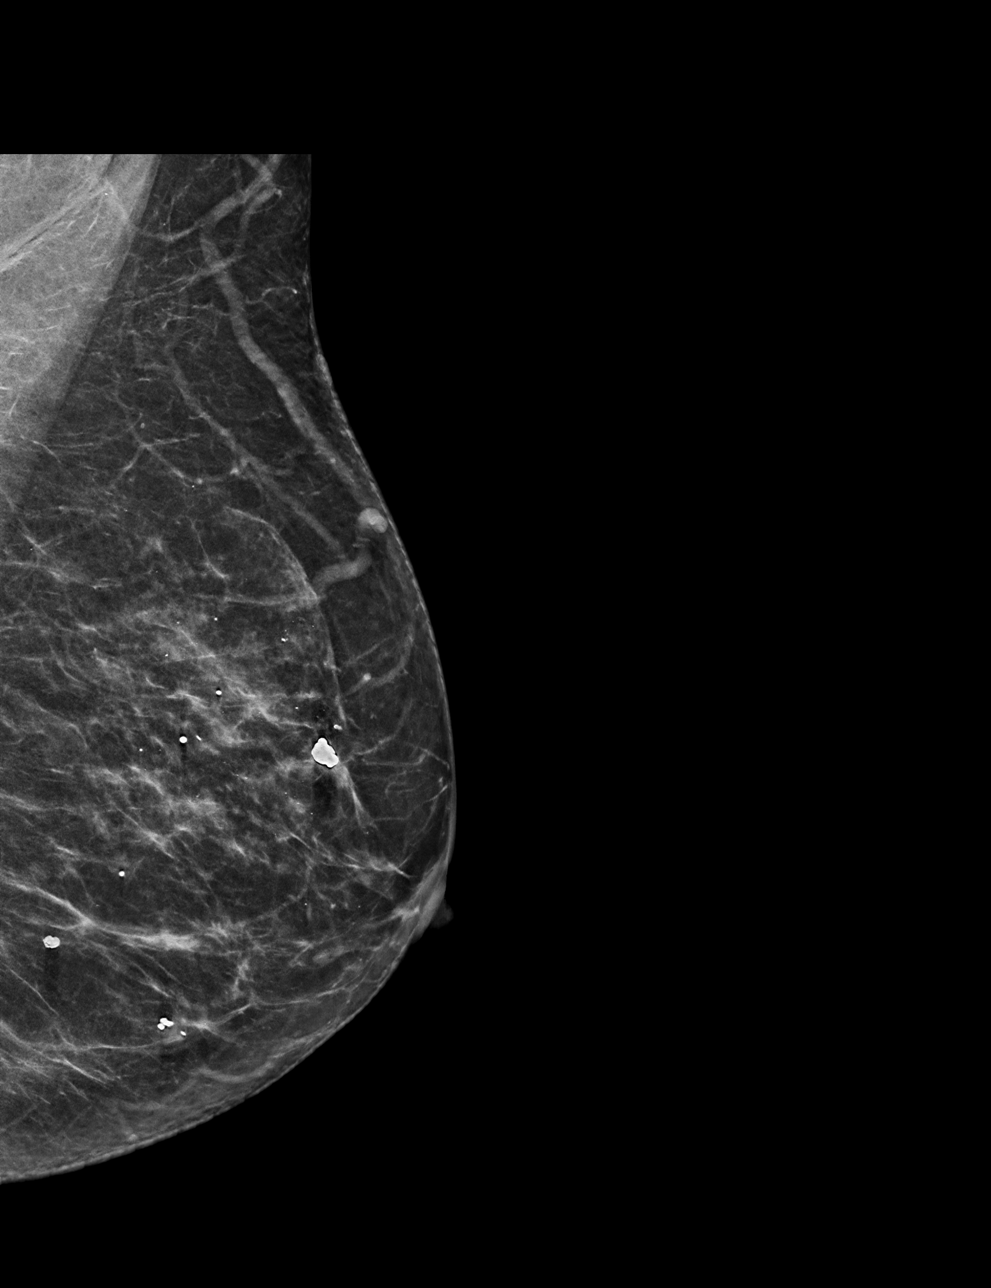

[L CC synth-2D]
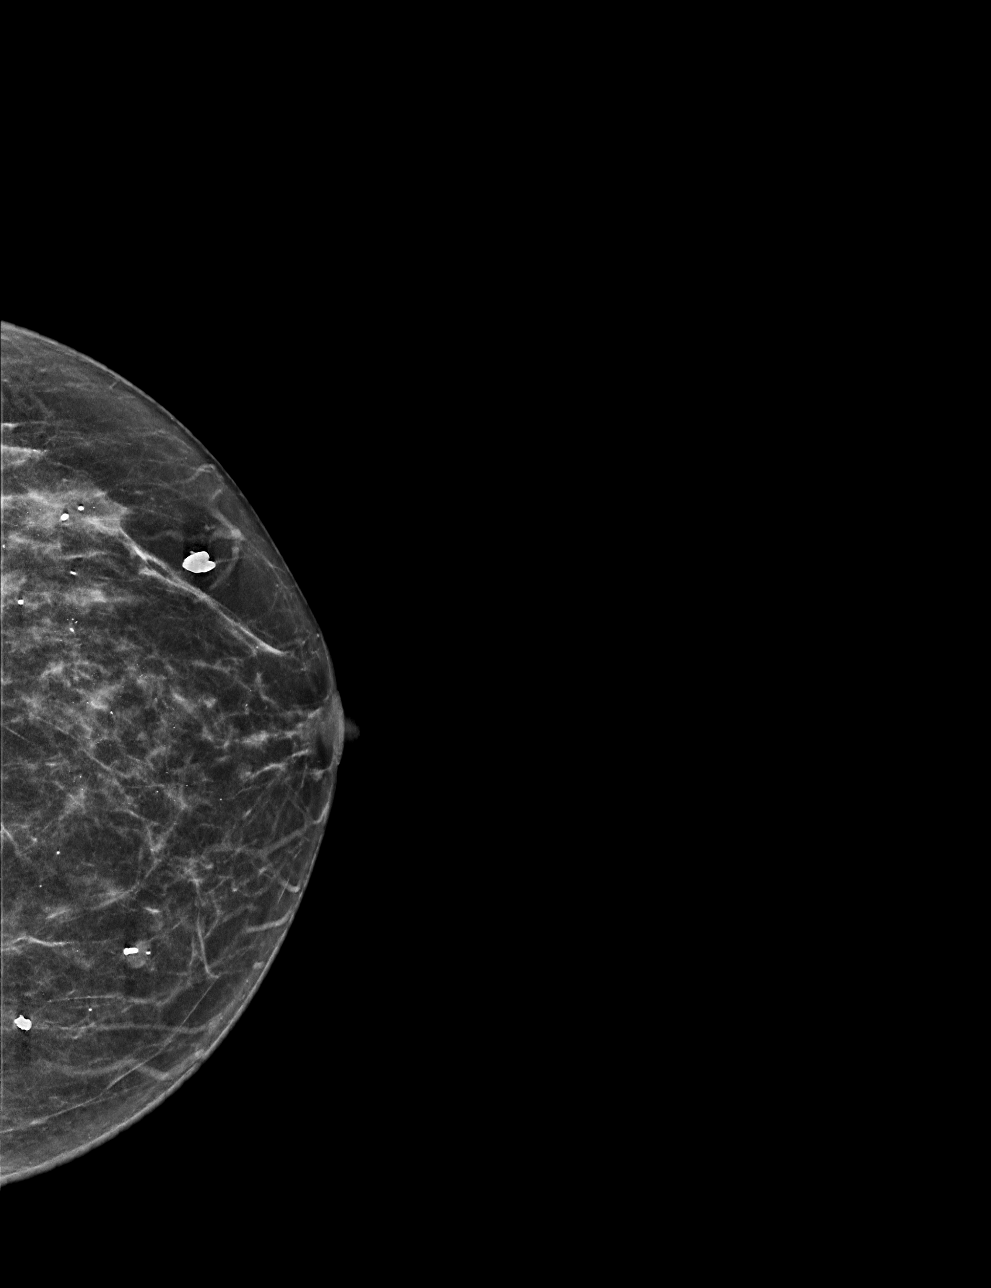

[L CC tomo · tomo slice 26/51.0]
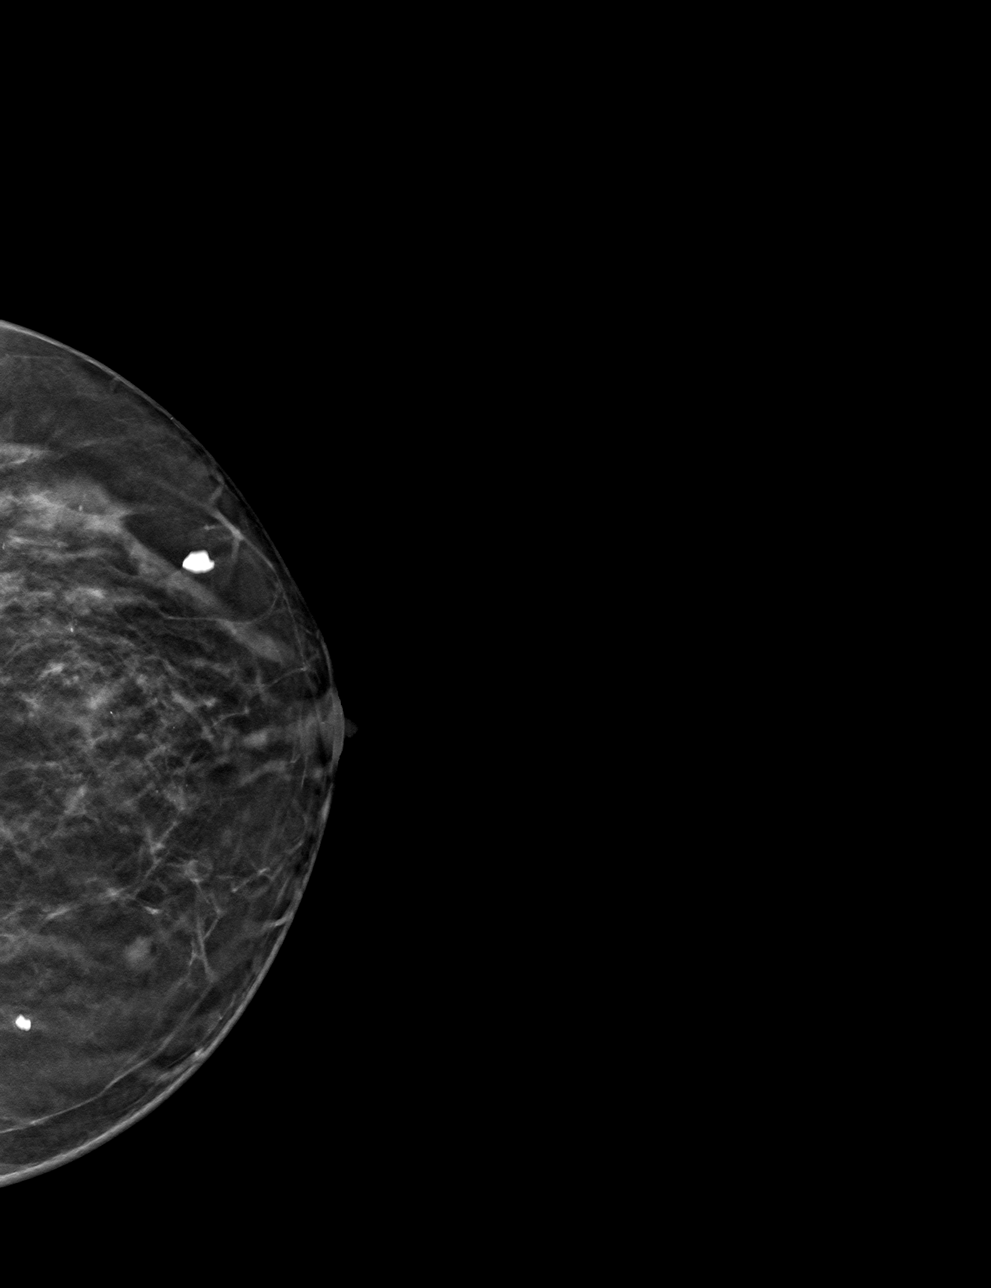

[L MLO tomo · tomo slice 30/59.0]
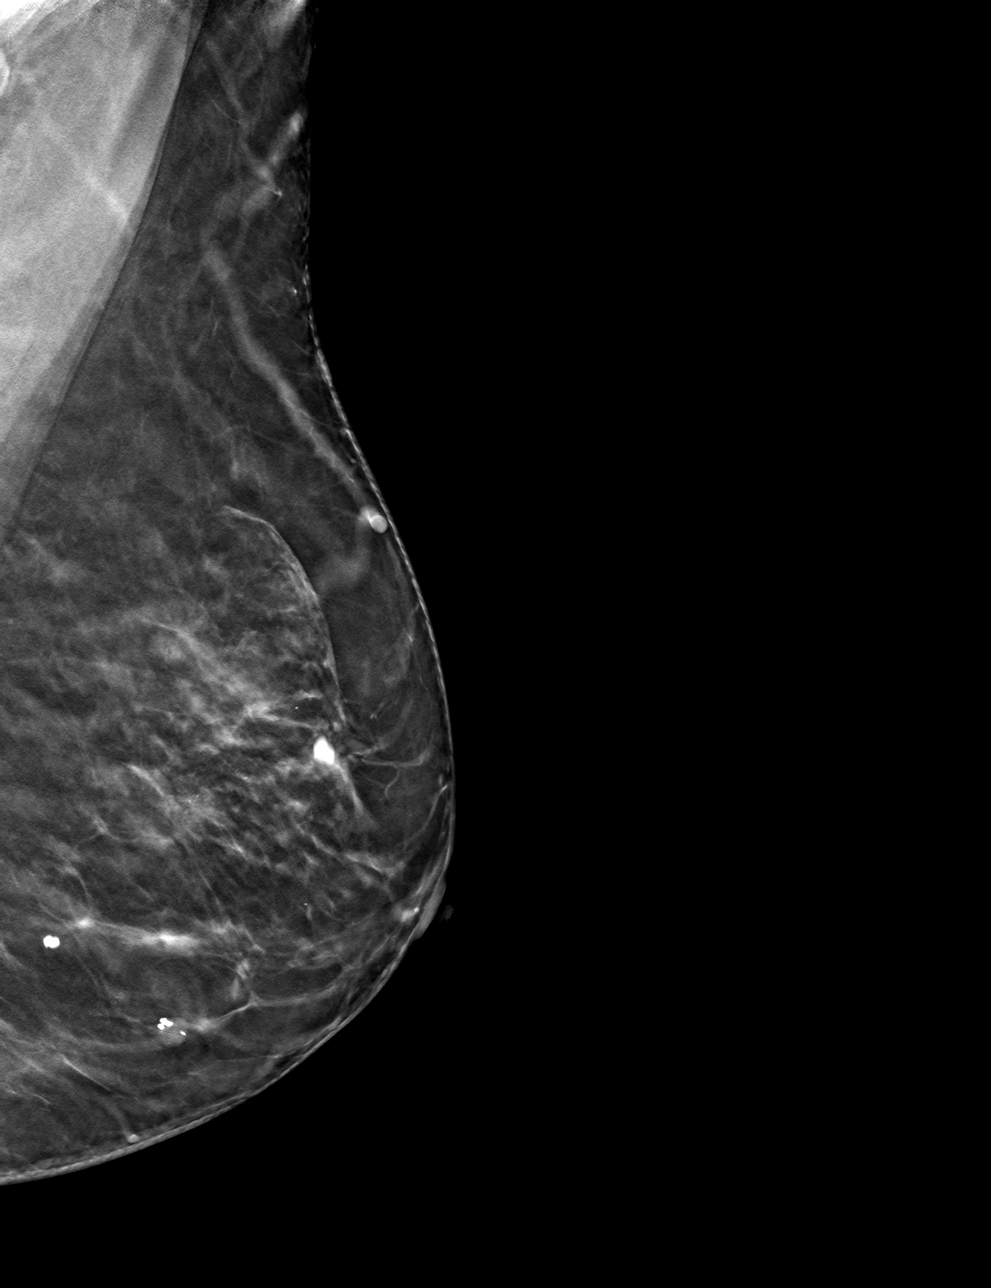

[4 of 12 positions shown; findings below may reference images not displayed]

ACR Breast Density Category c: The breast tissue is heterogeneously
dense, which may obscure small masses.
FINDINGS: The patient has had a right mastectomy. There are no findings
suspicious for malignancy.

Images were processed with CAD.
IMPRESSION: No mammographic evidence of malignancy. A result letter of this
screening mammogram will be mailed directly to the patient.

RECOMMENDATION:
Screening mammogram in one year.  (Code:SI-6-R6I)

BI-RADS CATEGORY  1: Negative.
